# Patient Record
Sex: Male | Born: 1960 | Race: White | Hispanic: No | Marital: Married | Smoking: Never smoker
Health system: Southern US, Community
[De-identification: ages and names within clinical notes are randomized; demographics above are authoritative.]

## PROBLEM LIST (undated history)

## (undated) DIAGNOSIS — R519 Headache, unspecified: Secondary | ICD-10-CM

## (undated) DIAGNOSIS — N189 Chronic kidney disease, unspecified: Secondary | ICD-10-CM

## (undated) DIAGNOSIS — I10 Essential (primary) hypertension: Secondary | ICD-10-CM

## (undated) DIAGNOSIS — B019 Varicella without complication: Secondary | ICD-10-CM

## (undated) DIAGNOSIS — R51 Headache: Secondary | ICD-10-CM

## (undated) DIAGNOSIS — J45909 Unspecified asthma, uncomplicated: Secondary | ICD-10-CM

## (undated) DIAGNOSIS — G43909 Migraine, unspecified, not intractable, without status migrainosus: Secondary | ICD-10-CM

## (undated) DIAGNOSIS — N200 Calculus of kidney: Secondary | ICD-10-CM

## (undated) HISTORY — PX: COLONOSCOPY: SHX174

## (undated) HISTORY — PX: LITHOTRIPSY: SUR834

## (undated) HISTORY — DX: Calculus of kidney: N20.0

## (undated) HISTORY — PX: EYE SURGERY: SHX253

## (undated) HISTORY — DX: Headache, unspecified: R51.9

## (undated) HISTORY — DX: Varicella without complication: B01.9

## (undated) HISTORY — DX: Migraine, unspecified, not intractable, without status migrainosus: G43.909

## (undated) HISTORY — PX: TONSILLECTOMY: SUR1361

## (undated) HISTORY — DX: Essential (primary) hypertension: I10

## (undated) HISTORY — DX: Unspecified asthma, uncomplicated: J45.909

## (undated) HISTORY — DX: Chronic kidney disease, unspecified: N18.9

## (undated) HISTORY — DX: Headache: R51

## (undated) HISTORY — PX: REFRACTIVE SURGERY: SHX103

---

## 2015-04-04 LAB — BASIC METABOLIC PANEL
BUN: 17 (ref 4–21)
Creatinine: 0.8 (ref 0.6–1.3)
Glucose: 86
POTASSIUM: 3.6 (ref 3.4–5.3)
Sodium: 140 (ref 137–147)

## 2015-08-11 LAB — BASIC METABOLIC PANEL
BUN: 17 (ref 4–21)
Creatinine: 0.8 (ref 0.6–1.3)
Glucose: 93
Potassium: 3.8 (ref 3.4–5.3)
Sodium: 142 (ref 137–147)

## 2015-08-11 LAB — HEPATIC FUNCTION PANEL
ALK PHOS: 67 (ref 25–125)
ALT: 19 (ref 10–40)
AST: 19 (ref 14–40)
BILIRUBIN, TOTAL: 0.7

## 2015-08-11 LAB — LIPID PANEL
CHOLESTEROL: 153 (ref 0–200)
HDL: 65 (ref 35–70)
LDL CALC: 75
Triglycerides: 64 (ref 40–160)

## 2015-08-11 LAB — CBC AND DIFFERENTIAL
HCT: 43 (ref 41–53)
HEMOGLOBIN: 14.3 (ref 13.5–17.5)
PLATELETS: 197 (ref 150–399)
WBC: 4.8

## 2015-08-11 LAB — HEMOGLOBIN A1C: Hemoglobin A1C: 5.4

## 2015-08-11 LAB — PSA: PSA: 0.7

## 2016-02-24 LAB — PSA: PSA: 0.7

## 2016-02-24 LAB — HEPATIC FUNCTION PANEL
ALK PHOS: 67 (ref 25–125)
ALT: 15 (ref 10–40)
AST: 19 (ref 14–40)
BILIRUBIN, TOTAL: 0.6

## 2016-02-24 LAB — LIPID PANEL
Cholesterol: 142 (ref 0–200)
HDL: 61 (ref 35–70)
LDL CALC: 68
Triglycerides: 53 (ref 40–160)

## 2016-02-24 LAB — HEMOGLOBIN A1C: Hemoglobin A1C: 4.9

## 2016-02-24 LAB — BASIC METABOLIC PANEL
BUN: 18 (ref 4–21)
Creatinine: 1 (ref 0.6–1.3)
GLUCOSE: 98
Potassium: 3.7 (ref 3.4–5.3)
Sodium: 142 (ref 137–147)

## 2016-02-24 LAB — CBC AND DIFFERENTIAL
HEMATOCRIT: 43 (ref 41–53)
Hemoglobin: 14.5 (ref 13.5–17.5)
PLATELETS: 201 (ref 150–399)
WBC: 4.9

## 2016-09-28 LAB — BASIC METABOLIC PANEL
BUN: 18 (ref 4–21)
Creatinine: 0.9 (ref 0.6–1.3)
Glucose: 70
Potassium: 3.6 (ref 3.4–5.3)
SODIUM: 143 (ref 137–147)

## 2016-09-28 LAB — HEPATIC FUNCTION PANEL
ALK PHOS: 61 (ref 25–125)
ALT: 11 (ref 10–40)
AST: 17 (ref 14–40)
BILIRUBIN, TOTAL: 0.4

## 2017-07-05 ENCOUNTER — Encounter: Payer: Self-pay | Admitting: Family Medicine

## 2017-07-05 ENCOUNTER — Ambulatory Visit (INDEPENDENT_AMBULATORY_CARE_PROVIDER_SITE_OTHER): Payer: Managed Care, Other (non HMO) | Admitting: Family Medicine

## 2017-07-05 ENCOUNTER — Ambulatory Visit: Payer: Managed Care, Other (non HMO) | Admitting: Family Medicine

## 2017-07-05 VITALS — BP 118/76 | HR 66 | Ht 69.0 in | Wt 164.2 lb

## 2017-07-05 DIAGNOSIS — G44019 Episodic cluster headache, not intractable: Secondary | ICD-10-CM | POA: Diagnosis not present

## 2017-07-05 DIAGNOSIS — Z0000 Encounter for general adult medical examination without abnormal findings: Secondary | ICD-10-CM | POA: Diagnosis not present

## 2017-07-05 DIAGNOSIS — I10 Essential (primary) hypertension: Secondary | ICD-10-CM | POA: Diagnosis not present

## 2017-07-05 DIAGNOSIS — Z7189 Other specified counseling: Secondary | ICD-10-CM | POA: Diagnosis not present

## 2017-07-05 DIAGNOSIS — Z7184 Encounter for health counseling related to travel: Secondary | ICD-10-CM

## 2017-07-05 LAB — COMPREHENSIVE METABOLIC PANEL
ALBUMIN: 4.5 g/dL (ref 3.5–5.2)
ALT: 15 U/L (ref 0–53)
AST: 17 U/L (ref 0–37)
Alkaline Phosphatase: 71 U/L (ref 39–117)
BILIRUBIN TOTAL: 0.6 mg/dL (ref 0.2–1.2)
BUN: 15 mg/dL (ref 6–23)
CALCIUM: 9.9 mg/dL (ref 8.4–10.5)
CO2: 32 meq/L (ref 19–32)
CREATININE: 0.87 mg/dL (ref 0.40–1.50)
Chloride: 103 mEq/L (ref 96–112)
GFR: 95.42 mL/min (ref >60.00)
Glucose, Bld: 91 mg/dL (ref 70–99)
Potassium: 3.7 mEq/L (ref 3.5–5.1)
Sodium: 143 mEq/L (ref 135–145)
Total Protein: 6.5 g/dL (ref 6.0–8.3)

## 2017-07-05 LAB — PSA: PSA: 0.63 ng/mL (ref 0.10–4.00)

## 2017-07-05 LAB — LIPID PANEL
CHOLESTEROL: 141 mg/dL (ref 0–200)
HDL: 59.9 mg/dL (ref >39.00)
LDL Cholesterol: 70 mg/dL (ref 0–99)
NonHDL: 81.47
TRIGLYCERIDES: 58 mg/dL (ref 0.0–149.0)
Total CHOL/HDL Ratio: 2
VLDL: 11.6 mg/dL (ref 0.0–40.0)

## 2017-07-05 LAB — URINALYSIS, ROUTINE W REFLEX MICROSCOPIC
BILIRUBIN URINE: NEGATIVE
HGB URINE DIPSTICK: NEGATIVE
Ketones, ur: NEGATIVE
LEUKOCYTES UA: NEGATIVE
Nitrite: NEGATIVE
Specific Gravity, Urine: <=1.005 — AB (ref 1.000–1.030)
TOTAL PROTEIN, URINE-UPE24: NEGATIVE
UROBILINOGEN UA: 0.2 (ref 0.0–1.0)
Urine Glucose: NEGATIVE
WBC UA: NONE SEEN — AB (ref 0–?)
pH: 7 (ref 5.0–8.0)

## 2017-07-05 LAB — CBC
HCT: 42.9 % (ref 39.0–52.0)
Hemoglobin: 14.5 g/dL (ref 13.0–17.0)
MCHC: 34 g/dL (ref 30.0–36.0)
MCV: 90.5 fl (ref 78.0–100.0)
PLATELETS: 217 10*3/uL (ref 150.0–400.0)
RBC: 4.74 Mil/uL (ref 4.22–5.81)
RDW: 13.3 % (ref 11.5–15.5)
WBC: 4.8 10*3/uL (ref 4.0–10.5)

## 2017-07-05 LAB — TSH: TSH: 1.68 u[IU]/mL (ref 0.35–4.50)

## 2017-07-05 MED ORDER — INDOMETHACIN 25 MG PO CAPS: ORAL_CAPSULE | ORAL | 1 refills | Status: DC

## 2017-07-05 MED ORDER — ATOVAQUONE-PROGUANIL HCL 250-100 MG PO TABS: ORAL_TABLET | ORAL | 1 refills | Status: DC

## 2017-07-05 MED ORDER — GABAPENTIN 300 MG PO CAPS: 300.0000 mg | ORAL_CAPSULE | Freq: Two times a day (BID) | ORAL | 1 refills | Status: DC

## 2017-07-05 MED ORDER — HYDROCHLOROTHIAZIDE 25 MG PO TABS: 25.0000 mg | ORAL_TABLET | Freq: Every day | ORAL | 1 refills | Status: DC

## 2017-07-05 MED ORDER — TYPHOID VACCINE PO CPDR: 1.0000 | DELAYED_RELEASE_CAPSULE | ORAL | 0 refills | Status: DC

## 2017-07-05 MED ORDER — LOSARTAN POTASSIUM 25 MG PO TABS: 25.0000 mg | ORAL_TABLET | Freq: Every day | ORAL | 1 refills | Status: DC

## 2017-07-05 NOTE — Patient Instructions (Addendum)
TravelVaccine Information Vaccines, also called immunizations, can protect you from certain diseases. Vaccines can also prevent the spread of certain infections. It is important to see your health care provider or a travel medicine specialist 4-6 weeks before you travel. This allows time for recommended vaccines to take effect. It also provides enough time for you to get vaccines that must be given in a series over a period of days or weeks. Vaccines for travelers include:  Routine vaccines. These vaccines are standard.  Recommended travel vaccines. These vaccines are generally recommended before international travel.  Geographically required travel vaccines. These vaccines are necessary before travel to some countries or regions.  If it is less than 4 weeks before you leave, you should still see your health care provider. You might still benefit from vaccines or medicines. What are routine vaccines? Routine vaccines are shots that can protect you from common diseases in many parts of the world. Most routine vaccines are given at certain ages starting in childhood. Routine vaccines also include the annual flu (influenza) vaccine. It is important that you are up to date on your routine vaccines before you travel. You may be advised to get extra doses, also called booster vaccines, such as the Tdap (tetanus, diphtheria, and pertussis). What are recommended vaccines? Recommended travel vaccines change over time. Your health care provider can tell you what vaccines are recommended before your trip. The most common recommended vaccines before travel are hepatitis A and typhoid vaccines. Know your travel schedule when you visit your health care provider. The vaccines that are recommended before foreign travel will depend on several factors, such as:  The country or countries of travel.  Whether you will be traveling to rural areas.  How long you will be traveling.  The season of the year.  Your  age. Older adults should get a vaccine against a certain type of pneumonia (pneumococcal) and a vaccine against shingles (herpes zoster).  Your health status.  Your previous vaccines.  The annual influenza vaccine sometimes differs for the northern and southern hemispheres. You should:  Get both vaccines if you are traveling to the other hemisphere, and you have a chronic medical condition.  Get the vaccine shortly before or during the flu season, and only if the vaccine in your country differs from the vaccine in your destination country.  Get the other influenza vaccine either before leaving the country or shortly after arriving at the destination country.  What are geographically required vaccines? Children should be up to date with all of the recommended vaccinations. Parents should follow the standard vaccination guidelines that are recommended by the pediatrician. Some vaccines may be required during an ongoing outbreak of an infectious disease in a country or region. Your health care provider will be able to tell you about any outbreaks and required vaccines. Some examples of required vaccines include: Yellow fever vaccine  Proof of yellow fever immunization is currently required for most people before traveling to certain countries in [place] and [place].  If proof of immunization is incomplete or inaccurate, you could be quarantined, denied entry, or given another dose of vaccine at the travel site.  This vaccine can only be obtained at approved centers.  You should get the yellow fever vaccine at least 10 days before your trip.  After 10 days, most people show immunity to yellow fever.  If it has been longer than 10 years since you received the yellow fever vaccine, another dose is required.  Meningococcal vaccine    Meningococcal immunization may be required prior to travel to parts of [place] and [place].  Proof of meningococcal immunization is required by the  [place] for any person older than age 59 who is taking part in [place] or [place].  Visas for traveling to take part in hajj or umrah will not even be issued until there is proof of immunization. You should get this vaccine at least 10 days before your trip.  After 10 days, most people show immunity.  If it has been longer than 3 years since your last immunization, another dose is required.  Some travel circumstances may require additional vaccination with the following vaccines:  Hepatitis B.  Rabies.  Tick-borne encephalitis.  Malaria.  Where to find more information:  Centers for Disease Control and Prevention (CDC): http://www.wolf.info/  World Health Organization [facility]): RoleLink.com.br This information is not intended to replace advice given to you by your health care provider. Make sure you discuss any questions you have with your health care provider. Document Released: 04/04/2009 Document Revised: 01/10/2016 Document Reviewed: 09/20/2015 Elsevier Interactive Patient Education  2018 [place] Typhoid Vaccine: What You Need to Know 1. What is typhoid? Typhoid (typhoid fever) is a serious disease. It is caused by bacteria called Salmonella Typhi. Typhoid causes a high fever, fatigue, weakness, stomach pains, headache, loss of appetite, and sometimes a rash. If it is not treated, it can kill up to 30% of people who get it. Some people who get typhoid become "carriers," who can spread the disease to others. Generally, people get typhoid from contaminated food or water. Typhoid is rare in the [place] and most [place] citizens who get the disease get it while traveling. Typhoid strikes about 21 million people a year around the world and kills about 200,000. 2. Typhoid vaccines Typhoid vaccine can prevent typhoid. There are two vaccines to prevent typhoid. One is an inactivated (killed) vaccine gotten as a shot. The other is a live, attenuated (weakened) vaccine which is taken  orally (by mouth). 3. Who should get typhoid vaccine and when? Routine typhoid vaccination is not recommended in the [place], but typhoid vaccine is recommended for:  Travelers to parts of the world where typhoid is common. (NOTE: typhoid vaccine is not 100% effective and is not a substitute for being careful about what you eat or drink).  People in close contact with a typhoid carrier.  Laboratory workers who work with Salmonella Typhi bacteria.  Inactivated typhoid vaccine (shot)  One dose provides protection. It should be given at least 2 weeks before travel to allow the vaccine time to work.  A booster dose is needed every 2 years for people who remain at risk. Live typhoid vaccine (oral)  Four doses: one capsule every other day for a week (day 1, day 3, day 5, and day 7). The last dose should be given at least 1 week before travel to allow the vaccine time to work.  Swallow each dose about an hour before a meal with a cold or lukewarm drink. Do not chew the capsule.  A booster dose is needed every 5 years for people who remain at risk. Either vaccine may safely be given at the same time as other vaccines. 4. Some people should not get typhoid vaccine or should wait Inactivated typhoid vaccine (shot)  Should not be given to children younger than 76 years of age.  Anyone who has had a severe reaction to a previous dose of this vaccine should  not get another dose.  Anyone who has a severe allergy to any component of this vaccine should not get it. Tell your doctor if you have any severe allergies.  Anyone who is moderately or severely ill at the time the shot is scheduled should usually wait until they recover before getting the vaccine. Live typhoid vaccine (oral)  Should not be given to children younger than 39 years of age.  Anyone who has had a severe reaction to a previous dose of this vaccine should not get another dose.  Anyone who has a severe allergy to any  component of this vaccine should not get it. Tell your doctor if you have any severe allergies.  Anyone who is moderately or severely ill at the time the vaccine is scheduled should usually wait until they recover before getting it. Tell your doctor if you have an illness involving vomiting or diarrhea.  Anyone whose immune system is weakened should not get this vaccine. They should get the typhoid shot instead. This includes anyone who: ? has HIV/AIDS or another disease that affects the immune system, ? is being treated with drugs that affect the immune system, such as steroids for 2 weeks or longer, ? has any kind of cancer, ? is taking cancer treatment with radiation or drugs.  Oral typhoid vaccine should not be given until at least 3 days after taking certain antibiotics. Ask your doctor for more information. 5. What are the risks from typhoid vaccine? Like any medicine, a vaccine could cause a serious problem, such as a severe allergic reaction. The risk of typhoid vaccine causing serious harm, or death, is extremely small. Serious problems from either typhoid vaccine are very rare. Inactivated typhoid vaccine (shot)  Mild reactions ? Fever (up to about 1 person in 100) ? Headache (up to about 1 person in 45) ? Redness or swelling at the site of the injection (up to about 1 person in 15) Live typhoid vaccine (oral)  Mild reactions ? Fever or headache (up to about 1 person in 20) ? Stomach pain, nausea, vomiting, rash (rare) 6. What if there is a serious reaction? What should I look for? Look for anything that concerns you, such as signs of a severe allergic reaction, very high fever, or behavior changes. Signs of a severe allergic reaction can include hives, swelling of the face and throat, difficulty breathing, a fast heartbeat, dizziness, and weakness. These would start a few minutes to a few hours after the vaccination. What should I do?  If you think it is a severe allergic  reaction or other emergency that can't wait, call 9-1-1 or get the person to the nearest hospital. Otherwise, call your doctor.  Afterward, the reaction should be reported to the Vaccine Adverse Event Reporting System (VAERS). Your doctor might file this report, or you can do it yourself through the VAERS web site at www.vaers.SamedayNews.es or by calling (925) 842-4771. VAERS is only for reporting reactions. They do not give medical advice. 7. How can I learn more?  Ask your doctor.  Contact the Centers for Disease Control and Prevention (CDC): ? Call 757-127-3752 (1-800-CDC-INFO) or ? Visit CDC's website at http://www.ingram.com/ CDC Vaccine Information Statement (VIS) Typhoid Vaccine (09/26/2010) This information is not intended to replace advice given to you by your health care provider. Make sure you discuss any questions you have with your health care provider. Document Released: 12/14/2010 Document Revised: 03/10/2016 Document Reviewed: 03/10/2016 Elsevier Interactive Patient Education  2017 [employer].

## 2017-07-05 NOTE — Progress Notes (Signed)
Subjective:  Patient ID: Donald Hampton, male    DOB: [birth year]  Age: 57 y.o. MRN: [id number]  CC: Establish Care   HPI [name] presents for the establishment of care.  He is healthy 57 year old.  Past medical history of hypertension controlled with Cozaar and hydrochlorothiazide.  He has no problems with these medicines.  He has a history of cluster headaches that have been treated successfully with Neurontin prophylaxis.  He uses Indocin every morning to treat the occasional recurrent headache.  DTaP last year due to the birth of his granddaughter.  Colonoscopy back in 2010.  He does not smoke or use illicit drugs.  Drinks alcohol only occasionally.  He exercises regularly.  He is a runner.  He travels [place] for his [place] and requests a prescription for Malarone.  He is taking this drug with success in the past with few side effects.  He did not have bad dreams but he admitted to having interesting dreams.  History Donald Hampton has a past medical history of Chicken pox, Frequent headaches, Hypertension, and Migraines.   He has a past surgical history that includes Tonsillectomy.   His family history includes Arthritis in his father; Early death in his paternal grandfather and paternal grandmother; Heart disease in his maternal grandfather.He reports that  has never smoked. he has never used smokeless tobacco. He reports that he drinks alcohol. He reports that he does not use drugs.  Outpatient Medications Prior to Visit  Medication Sig Dispense Refill  . gabapentin (NEURONTIN) 300 MG capsule Take 1 capsule by mouth 2 (two) times daily.    . hydrochlorothiazide (HYDRODIURIL) 25 MG tablet Take 1 tablet by mouth daily.    . indomethacin (INDOCIN) 25 MG capsule Take 50 mg by mouth as needed.    Marland Kitchen losartan (COZAAR) 25 MG tablet Take 1 tablet by mouth daily.     No facility-administered medications prior to visit.     ROS Review of Systems  Constitutional: Negative.     HENT: Negative.   Eyes: Negative.   Respiratory: Negative.   Cardiovascular: Negative.   Gastrointestinal: Negative.   Endocrine: Negative for cold intolerance and heat intolerance.  Genitourinary: Negative for difficulty urinating, frequency and urgency.  Musculoskeletal: Negative for arthralgias and myalgias.  Skin: Negative for color change and rash.  Allergic/Immunologic: Negative for immunocompromised state.  Neurological: Negative.   Hematological: Negative.   Psychiatric/Behavioral: Negative.     Objective:  BP 118/76 (BP Location: Left Arm, Patient Position: Sitting, Cuff Size: Normal)   Pulse 66   Ht 5\' 9"  (1.753 m)   Wt 164 lb 4 oz (74.5 kg)   SpO2 98%   BMI 24.26 kg/m   Physical Exam  Constitutional: He is oriented to person, place, and time. He appears well-developed and well-nourished. No distress.  HENT:  Head: Normocephalic and atraumatic.  Right Ear: External ear normal.  Left Ear: External ear normal.  Mouth/Throat: Oropharynx is clear and moist. No oropharyngeal exudate.  Eyes: Conjunctivae are normal. Pupils are equal, round, and reactive to light. Right eye exhibits no discharge. Left eye exhibits no discharge. No scleral icterus.  Neck: Neck supple. No JVD present. No tracheal deviation present. No thyromegaly present.  Cardiovascular: Normal rate, regular rhythm and normal heart sounds.  Pulmonary/Chest: Effort normal and breath sounds normal. No stridor. No respiratory distress. He has no wheezes. He has no rales.  Abdominal: Bowel sounds are normal. He exhibits no distension. There is no tenderness. There is no  rebound and no guarding.  Lymphadenopathy:    He has no cervical adenopathy.  Neurological: He is alert and oriented to person, place, and time.  Skin: Skin is warm and dry. He is not diaphoretic.  Psychiatric: He has a normal mood and affect. His behavior is normal.      Assessment & Plan:   Jarrah was seen today for establish  care.  Diagnoses and all orders for this visit:  Essential hypertension -     CBC -     Comprehensive metabolic panel -     Urinalysis, Routine w reflex microscopic -     TSH -     hydrochlorothiazide (HYDRODIURIL) 25 MG tablet; Take 1 tablet (25 mg total) by mouth daily. -     losartan (COZAAR) 25 MG tablet; Take 1 tablet (25 mg total) by mouth daily.  Episodic cluster headache, not intractable -     CBC -     gabapentin (NEURONTIN) 300 MG capsule; Take 1 capsule (300 mg total) by mouth 2 (two) times daily. -     indomethacin (INDOCIN) 25 MG capsule; Take 1-2 tablets every 8 hours with food as needed.  Health care maintenance -     CBC -     Comprehensive metabolic panel -     HIV antibody -     Lipid panel -     PSA  Travel advice encounter -     atovaquone-proguanil (MALARONE) 250-100 MG TABS tablet; One tablet daily starting 2 days prior to exposure and continue for one week after leaving malaria prone area. -     typhoid (VIVOTIF) DR capsule; Take 1 capsule by mouth every other day.   I have changed [facility]. [facility] gabapentin, hydrochlorothiazide, indomethacin, and losartan. I am also having him start on atovaquone-proguanil and typhoid.  Meds ordered this encounter  Medications  . gabapentin (NEURONTIN) 300 MG capsule    Sig: Take 1 capsule (300 mg total) by mouth 2 (two) times daily.    Dispense:  180 capsule    Refill:  1  . hydrochlorothiazide (HYDRODIURIL) 25 MG tablet    Sig: Take 1 tablet (25 mg total) by mouth daily.    Dispense:  100 tablet    Refill:  1  . indomethacin (INDOCIN) 25 MG capsule    Sig: Take 1-2 tablets every 8 hours with food as needed.    Dispense:  60 capsule    Refill:  1  . losartan (COZAAR) 25 MG tablet    Sig: Take 1 tablet (25 mg total) by mouth daily.    Dispense:  100 tablet    Refill:  1  . atovaquone-proguanil (MALARONE) 250-100 MG TABS tablet    Sig: One tablet daily starting 2 days prior to exposure and continue for one  week after leaving malaria prone area.    Dispense:  30 tablet    Refill:  1  . typhoid (VIVOTIF) DR capsule    Sig: Take 1 capsule by mouth every other day.    Dispense:  4 capsule    Refill:  0     Follow-up: Return in about 6 months (around 01/05/2018).  Libby Maw, MD

## 2017-07-06 LAB — HIV ANTIBODY (ROUTINE TESTING W REFLEX): HIV 1&2 Ab, 4th Generation: NONREACTIVE

## 2017-07-16 ENCOUNTER — Encounter: Payer: Self-pay | Admitting: Family Medicine

## 2017-12-22 ENCOUNTER — Other Ambulatory Visit: Payer: Self-pay | Admitting: Family Medicine

## 2017-12-22 DIAGNOSIS — G44019 Episodic cluster headache, not intractable: Secondary | ICD-10-CM

## 2018-01-06 ENCOUNTER — Ambulatory Visit (INDEPENDENT_AMBULATORY_CARE_PROVIDER_SITE_OTHER): Payer: Managed Care, Other (non HMO) | Admitting: Family Medicine

## 2018-01-06 ENCOUNTER — Encounter: Payer: Self-pay | Admitting: Family Medicine

## 2018-01-06 VITALS — BP 122/80 | HR 65 | Temp 98.8°F | Ht 69.0 in | Wt 163.6 lb

## 2018-01-06 DIAGNOSIS — I10 Essential (primary) hypertension: Secondary | ICD-10-CM

## 2018-01-06 DIAGNOSIS — H6992 Unspecified Eustachian tube disorder, left ear: Secondary | ICD-10-CM | POA: Insufficient documentation

## 2018-01-06 DIAGNOSIS — J019 Acute sinusitis, unspecified: Secondary | ICD-10-CM | POA: Diagnosis not present

## 2018-01-06 LAB — BASIC METABOLIC PANEL
BUN: 14 mg/dL (ref 6–23)
CALCIUM: 9.7 mg/dL (ref 8.4–10.5)
CO2: 28 mEq/L (ref 19–32)
Chloride: 101 mEq/L (ref 96–112)
Creatinine, Ser: 0.85 mg/dL (ref 0.40–1.50)
GFR: 97.85 mL/min (ref >60.00)
GLUCOSE: 91 mg/dL (ref 70–99)
POTASSIUM: 5.2 meq/L — AB (ref 3.5–5.1)
SODIUM: 137 meq/L (ref 135–145)

## 2018-01-06 LAB — CBC
HEMATOCRIT: 40.4 % (ref 39.0–52.0)
Hemoglobin: 13.9 g/dL (ref 13.0–17.0)
MCHC: 34.5 g/dL (ref 30.0–36.0)
MCV: 88.1 fl (ref 78.0–100.0)
Platelets: 357 10*3/uL (ref 150.0–400.0)
RBC: 4.58 Mil/uL (ref 4.22–5.81)
RDW: 13 % (ref 11.5–15.5)
WBC: 8.6 10*3/uL (ref 4.0–10.5)

## 2018-01-06 MED ORDER — HYDROCHLOROTHIAZIDE 25 MG PO TABS: 25.0000 mg | ORAL_TABLET | Freq: Every day | ORAL | 1 refills | Status: DC

## 2018-01-06 MED ORDER — LOSARTAN POTASSIUM 25 MG PO TABS: 25.0000 mg | ORAL_TABLET | Freq: Every day | ORAL | 1 refills | Status: DC

## 2018-01-06 MED ORDER — PREDNISONE 20 MG PO TABS: 20.0000 mg | ORAL_TABLET | Freq: Two times a day (BID) | ORAL | 0 refills | Status: AC

## 2018-01-06 MED ORDER — AMOXICILLIN-POT CLAVULANATE 500-125 MG PO TABS: 1.0000 | ORAL_TABLET | Freq: Three times a day (TID) | ORAL | 0 refills | Status: AC

## 2018-01-06 NOTE — Progress Notes (Signed)
Subjective:  Patient ID: Donald Hampton, male    DOB: 11-28-1960  Age: 57 y.o. MRN: [id number]  CC: Sinusitis   HPI Donald Hampton presents for follow-up of a 2-week history of URI signs and symptoms that now include ongoing purulent rhinorrhea postnasal drip with a frontal headache and extreme fatigue and malaise.  Patient has had little cough, reactive airway disease or fever and chills.  This actually had started while he was on overseas assignment in the [place].  He was seen a week ago in [place] and prescribe 7 days of Augmentin 500 mg/125 every 8 for 7 days.  He was treated for conjunctivitis with chloramphenicol.  He also had noted a bloody discharge on his pillow from his left ear.  Did have a history of otitis media as a child.  He tells me that the [place] physician in wanted to hospitalize him.  Patient has been traveling from the [place] to here in 2 weeks cycles for prolonged period of time.  He is home for 2 days and then travels back to [place] for 2 weeks.  Fortunately he says that this is about to stop for him.  Outpatient Medications Prior to Visit  Medication Sig Dispense Refill  . gabapentin (NEURONTIN) 300 MG capsule TAKE 1 CAPSULE BY MOUTH TWICE A DAY 180 capsule 0  . indomethacin (INDOCIN) 25 MG capsule Take 1-2 tablets every 8 hours with food as needed. 60 capsule 1  . hydrochlorothiazide (HYDRODIURIL) 25 MG tablet Take 1 tablet (25 mg total) by mouth daily. 100 tablet 1  . losartan (COZAAR) 25 MG tablet Take 1 tablet (25 mg total) by mouth daily. 100 tablet 1  . atovaquone-proguanil (MALARONE) 250-100 MG TABS tablet One tablet daily starting 2 days prior to exposure and continue for one week after leaving malaria prone area. (Patient not taking: Reported on 01/06/2018) 30 tablet 1  . typhoid (VIVOTIF) DR capsule Take 1 capsule by mouth every other day. (Patient not taking: Reported on 01/06/2018) 4 capsule 0   No facility-administered medications prior to visit.      ROS Review of Systems  Constitutional: Positive for fatigue. Negative for chills, diaphoresis, fever and unexpected weight change.  HENT: Positive for congestion, ear discharge, ear pain, hearing loss, postnasal drip, rhinorrhea, sinus pressure and sinus pain. Negative for trouble swallowing and voice change.   Eyes: Negative for photophobia and visual disturbance.  Respiratory: Negative for chest tightness, shortness of breath and wheezing.   Cardiovascular: Negative.   Gastrointestinal: Negative.   Endocrine: Negative for cold intolerance and heat intolerance.  Genitourinary: Negative.   Musculoskeletal: Negative for gait problem and joint swelling.  Skin: Negative for pallor.  Allergic/Immunologic: Negative for immunocompromised state.  Neurological: Positive for weakness and headaches. Negative for numbness.  Hematological: Does not bruise/bleed easily.  Psychiatric/Behavioral: Negative.     Objective:  BP 122/80 (BP Location: Left Arm, Patient Position: Sitting, Cuff Size: Normal)   Pulse 65   Temp 98.8 F (37.1 C) (Oral)   Ht 5\' 9"  (1.753 m)   Wt 163 lb 9.6 oz (74.2 kg)   SpO2 97%   BMI 24.16 kg/m   BP Readings from Last 3 Encounters:  01/06/18 122/80  07/05/17 118/76    Wt Readings from Last 3 Encounters:  01/06/18 163 lb 9.6 oz (74.2 kg)  07/05/17 164 lb 4 oz (74.5 kg)    Physical Exam  Constitutional: He is oriented to person, place, and time. He appears well-developed and well-nourished.  No distress.  HENT:  Head: Normocephalic and atraumatic. Head is without right periorbital erythema and without left periorbital erythema.  Right Ear: External ear normal.  Left Ear: External ear normal. Tympanic membrane is not injected, not erythematous, not retracted and not bulging.  No middle ear effusion.  Ears:  Mouth/Throat: Oropharynx is clear and moist. No oropharyngeal exudate.  Eyes: Pupils are equal, round, and reactive to light. Conjunctivae and EOM are  normal. Right eye exhibits no discharge. Left eye exhibits no discharge. No scleral icterus.  Neck: Normal range of motion. Neck supple. No JVD present. No tracheal deviation present.  Cardiovascular: Normal rate, regular rhythm and normal heart sounds.  Pulmonary/Chest: Effort normal and breath sounds normal.  Abdominal: Bowel sounds are normal.  Neurological: He is alert and oriented to person, place, and time.  Skin: Skin is warm and dry. He is not diaphoretic.  Psychiatric: He has a normal mood and affect. His behavior is normal.    Lab Results  Component Value Date   WBC 4.8 07/05/2017   HGB 14.5 07/05/2017   HCT 42.9 07/05/2017   PLT 217.0 07/05/2017   GLUCOSE 91 07/05/2017   CHOL 141 07/05/2017   TRIG 58.0 07/05/2017   HDL 59.90 07/05/2017   LDLCALC 70 07/05/2017   ALT 15 07/05/2017   AST 17 07/05/2017   NA 143 07/05/2017   K 3.7 07/05/2017   CL 103 07/05/2017   CREATININE 0.87 07/05/2017   BUN 15 07/05/2017   CO2 32 07/05/2017   TSH 1.68 07/05/2017   PSA 0.63 07/05/2017   HGBA1C 4.9 02/24/2016    Patient was never admitted.  Assessment & Plan:   Donald Hampton was seen today for sinusitis.  Diagnoses and all orders for this visit:  Essential hypertension -     hydrochlorothiazide (HYDRODIURIL) 25 MG tablet; Take 1 tablet (25 mg total) by mouth daily. -     losartan (COZAAR) 25 MG tablet; Take 1 tablet (25 mg total) by mouth daily. -     CBC -     Basic metabolic panel  Acute sinusitis, recurrence not specified, unspecified location -     amoxicillin-clavulanate (AUGMENTIN) 500-125 MG tablet; Take 1 tablet (500 mg total) by mouth 3 (three) times daily for 7 days. -     predniSONE (DELTASONE) 20 MG tablet; Take 1 tablet (20 mg total) by mouth 2 (two) times daily with a meal for 7 days. -     CBC  Eustachian tube disorder, left -     predniSONE (DELTASONE) 20 MG tablet; Take 1 tablet (20 mg total) by mouth 2 (two) times daily with a meal for 7 days.   I am having  Donald Hampton. Donald Hampton start on amoxicillin-clavulanate and predniSONE. I am also having him maintain his indomethacin, atovaquone-proguanil, typhoid, gabapentin, hydrochlorothiazide, and losartan.  Meds ordered this encounter  Medications  . hydrochlorothiazide (HYDRODIURIL) 25 MG tablet    Sig: Take 1 tablet (25 mg total) by mouth daily.    Dispense:  100 tablet    Refill:  1  . losartan (COZAAR) 25 MG tablet    Sig: Take 1 tablet (25 mg total) by mouth daily.    Dispense:  100 tablet    Refill:  1  . amoxicillin-clavulanate (AUGMENTIN) 500-125 MG tablet    Sig: Take 1 tablet (500 mg total) by mouth 3 (three) times daily for 7 days.    Dispense:  21 tablet    Refill:  0  . predniSONE (DELTASONE)  20 MG tablet    Sig: Take 1 tablet (20 mg total) by mouth 2 (two) times daily with a meal for 7 days.    Dispense:  14 tablet    Refill:  0   We will treat with another 7 days of Augmentin and prednisone 20 twice daily.  Information given regarding eustachian tube dysfunction.  Refill BP meds and checking BMP and CBC.  Follow-up next week if not continuing to improve.  Follow-up: Return in about 6 months (around 07/07/2018), or if symptoms worsen or fail to improve.  Libby Maw, MD

## 2018-01-06 NOTE — Patient Instructions (Signed)
Eustachian Tube Dysfunction The eustachian tube connects the middle ear to the back of the nose. It regulates air pressure in the middle ear by allowing air to move between the ear and nose. It also helps to drain fluid from the middle ear space. When the eustachian tube does not function properly, air pressure, fluid, or both can build up in the middle ear. Eustachian tube dysfunction can affect one or both ears. What are the causes? This condition happens when the eustachian tube becomes blocked or cannot open normally. This may result from:  Ear infections.  Colds and other upper respiratory infections.  Allergies.  Irritation, such as from cigarette smoke or acid from the stomach coming up into the esophagus (gastroesophageal reflux).  Sudden changes in air pressure, such as from descending in an airplane.  Abnormal growths in the nose or throat, such as nasal polyps, tumors, or enlarged tissue at the back of the throat (adenoids).  What increases the risk? This condition may be more likely to develop in people who smoke and people who are overweight. Eustachian tube dysfunction may also be more likely to develop in children, especially children who have:  Certain birth defects of the mouth, such as cleft palate.  Large tonsils and adenoids.  What are the signs or symptoms? Symptoms of this condition may include:  A feeling of fullness in the ear.  Ear pain.  Clicking or popping noises in the ear.  Ringing in the ear.  Hearing loss.  Loss of balance.  Symptoms may get worse when the air pressure around you changes, such as when you travel to an area of high elevation or fly on an airplane. How is this diagnosed? This condition may be diagnosed based on:  Your symptoms.  A physical exam of your ear, nose, and throat.  Tests, such as those that measure: ? The movement of your eardrum (tympanogram). ? Your hearing (audiometry).  How is this treated? Treatment  depends on the cause and severity of your condition. If your symptoms are mild, you may be able to relieve your symptoms by moving air into ("popping") your ears. If you have symptoms of fluid in your ears, treatment may include:  Decongestants.  Antihistamines.  Nasal sprays or ear drops that contain medicines that reduce swelling (steroids).  In some cases, you may need to have a procedure to drain the fluid in your eardrum (myringotomy). In this procedure, a small tube is placed in the eardrum to:  Drain the fluid.  Restore the air in the middle ear space.  Follow these instructions at home:  Take over-the-counter and prescription medicines only as told by your health care provider.  Use techniques to help pop your ears as recommended by your health care provider. These may include: ? Chewing gum. ? Yawning. ? Frequent, forceful swallowing. ? Closing your mouth, holding your nose closed, and gently blowing as if you are trying to blow air out of your nose.  Do not do any of the following until your health care provider approves: ? Travel to high altitudes. ? Fly in airplanes. ? Work in a pressurized cabin or room. ? Scuba dive.  Keep your ears dry. Dry your ears completely after showering or bathing.  Do not smoke.  Keep all follow-up visits as told by your health care provider. This is important. Contact a health care provider if:  Your symptoms do not go away after treatment.  Your symptoms come back after treatment.  You are   unable to pop your ears.  You have: ? A fever. ? Pain in your ear. ? Pain in your head or neck. ? Fluid draining from your ear.  Your hearing suddenly changes.  You become very dizzy.  You lose your balance. This information is not intended to replace advice given to you by your health care provider. Make sure you discuss any questions you have with your health care provider. Document Released: 05/13/2015 Document Revised: 09/22/2015  Document Reviewed: 05/05/2014 Elsevier Interactive Patient Education  2018 [employer]vier Inc.  

## 2018-01-10 ENCOUNTER — Ambulatory Visit: Payer: Managed Care, Other (non HMO) | Admitting: Family Medicine

## 2018-03-13 ENCOUNTER — Ambulatory Visit (INDEPENDENT_AMBULATORY_CARE_PROVIDER_SITE_OTHER): Payer: Managed Care, Other (non HMO) | Admitting: Family Medicine

## 2018-03-13 ENCOUNTER — Encounter: Payer: Self-pay | Admitting: Family Medicine

## 2018-03-13 VITALS — BP 128/70 | HR 57 | Ht 69.0 in | Wt 162.5 lb

## 2018-03-13 DIAGNOSIS — I10 Essential (primary) hypertension: Secondary | ICD-10-CM

## 2018-03-13 DIAGNOSIS — N528 Other male erectile dysfunction: Secondary | ICD-10-CM

## 2018-03-13 DIAGNOSIS — K4090 Unilateral inguinal hernia, without obstruction or gangrene, not specified as recurrent: Secondary | ICD-10-CM

## 2018-03-13 MED ORDER — LOSARTAN POTASSIUM 50 MG PO TABS: 50.0000 mg | ORAL_TABLET | Freq: Every day | ORAL | 1 refills | Status: DC

## 2018-03-13 NOTE — Patient Instructions (Signed)

## 2018-03-13 NOTE — Progress Notes (Signed)
Subjective:  Patient ID: Donald Hampton, male    DOB: 04-17-1961  Age: 57 y.o. MRN: [id number]  CC: pain in groin area (x a couple weeks, not severe)   HPI Donald Hampton presents for evaluation of discomfort in his right groin area over the last month or so.  He is married in a stable relationship and not active outside of the marriage.  There is been no dysuria frequency or discharge.  He is also experienced new onset ED over the last several weeks.  He is quite active physically he is a long-distance runner.  He has not been riding his bicycle excessively.  Last HDL was 59 with an LDL of 70.  PSA was 0.63.  He does not smoke.  He rarely drinks alcohol.  He is a high stress job that involves frequent overseas and stateside air travel.  He is taking HCTZ for years pressure control.  Urine flow is excellent without hesitancy or decreased force of stream.  Outpatient Medications Prior to Visit  Medication Sig Dispense Refill  . gabapentin (NEURONTIN) 300 MG capsule TAKE 1 CAPSULE BY MOUTH TWICE A DAY 180 capsule 0  . indomethacin (INDOCIN) 25 MG capsule Take 1-2 tablets every 8 hours with food as needed. 60 capsule 1  . hydrochlorothiazide (HYDRODIURIL) 25 MG tablet Take 1 tablet (25 mg total) by mouth daily. 100 tablet 1  . losartan (COZAAR) 25 MG tablet Take 1 tablet (25 mg total) by mouth daily. 100 tablet 1  . atovaquone-proguanil (MALARONE) 250-100 MG TABS tablet One tablet daily starting 2 days prior to exposure and continue for one week after leaving malaria prone area. (Patient not taking: Reported on 01/06/2018) 30 tablet 1  . typhoid (VIVOTIF) DR capsule Take 1 capsule by mouth every other day. (Patient not taking: Reported on 01/06/2018) 4 capsule 0   No facility-administered medications prior to visit.     ROS Review of Systems  Constitutional: Negative.   HENT: Negative.   Respiratory: Negative.   Cardiovascular: Negative.   Gastrointestinal: Negative.   Genitourinary:  Negative for difficulty urinating, discharge, dysuria, frequency and urgency.  Musculoskeletal: Negative for arthralgias and myalgias.  Skin: Negative.   Neurological: Negative for weakness and numbness.  Psychiatric/Behavioral: Negative.     Objective:  BP 128/70 (BP Location: Left Arm, Patient Position: Sitting, Cuff Size: Normal)   Pulse (!) 57   Ht 5\' 9"  (1.753 m)   Wt 162 lb 8 oz (73.7 kg)   SpO2 99%   BMI 24.00 kg/m   BP Readings from Last 3 Encounters:  03/13/18 128/70  01/06/18 122/80  07/05/17 118/76    Wt Readings from Last 3 Encounters:  03/13/18 162 lb 8 oz (73.7 kg)  01/06/18 163 lb 9.6 oz (74.2 kg)  07/05/17 164 lb 4 oz (74.5 kg)    Physical Exam  Constitutional: He is oriented to person, place, and time. He appears well-developed and well-nourished. No distress.  HENT:  Head: Normocephalic and atraumatic.  Right Ear: External ear normal.  Left Ear: External ear normal.  Eyes: Right eye exhibits no discharge. Left eye exhibits no discharge. No scleral icterus.  Pulmonary/Chest: Effort normal.  Abdominal: Soft. Bowel sounds are normal. He exhibits no distension and no mass. There is no tenderness. There is no guarding. A hernia is present. Hernia confirmed positive in the right inguinal area. Hernia confirmed negative in the ventral area.  Genitourinary: Right testis shows no mass, no swelling and no tenderness. Right testis is  descended. Left testis shows no mass, no swelling and no tenderness. Left testis is descended. Circumcised. No hypospadias, penile erythema or penile tenderness. No discharge found.  Neurological: He is alert and oriented to person, place, and time.  Skin: Skin is warm and dry. He is not diaphoretic.    Lab Results  Component Value Date   WBC 8.6 01/06/2018   HGB 13.9 01/06/2018   HCT 40.4 01/06/2018   PLT 357.0 01/06/2018   GLUCOSE 91 01/06/2018   CHOL 141 07/05/2017   TRIG 58.0 07/05/2017   HDL 59.90 07/05/2017   LDLCALC 70  07/05/2017   ALT 15 07/05/2017   AST 17 07/05/2017   NA 137 01/06/2018   K 5.2 (H) 01/06/2018   CL 101 01/06/2018   CREATININE 0.85 01/06/2018   BUN 14 01/06/2018   CO2 28 01/06/2018   TSH 1.68 07/05/2017   PSA 0.63 07/05/2017   HGBA1C 4.9 02/24/2016    Patient was never admitted.  Assessment & Plan:   Donald Hampton was seen today for pain in groin area.  Diagnoses and all orders for this visit:  Essential hypertension -     losartan (COZAAR) 50 MG tablet; Take 1 tablet (50 mg total) by mouth daily.  Unilateral inguinal hernia without obstruction or gangrene, recurrence not specified -     Ambulatory referral to General Surgery  Other male erectile dysfunction   I have discontinued Donald Hampton. Donald Hampton atovaquone-proguanil, typhoid, hydrochlorothiazide, and losartan. I am also having him start on losartan. Additionally, I am having him maintain his indomethacin and gabapentin.  Meds ordered this encounter  Medications  . losartan (COZAAR) 50 MG tablet    Sig: Take 1 tablet (50 mg total) by mouth daily.    Dispense:  90 tablet    Refill:  1   Have discontinued HCTZ and increased Cozaar to 50 mg daily.  Patient would like to try this before trying to PD he inhibitor.  Anticipatory guidance was given on the ED.  Patient will check and record his blood pressures a month.  Follow-up: Return in about 1 month (around 04/12/2018).  Libby Maw, MD

## 2018-03-24 ENCOUNTER — Ambulatory Visit: Payer: Self-pay | Admitting: Surgery

## 2018-03-24 NOTE — H&P (Signed)
Donald Hampton Documented: 03/24/2018 8:44 AM Location: [facility] Patient #: [id number] DOB: Mar 12, 1961 Married / Language: [name] / Race: White Male  History of Present Illness Donald Hector MD; 03/24/2018 9:35 AM) The patient is a 57 year old male who presents with an inguinal hernia. Note for "Inguinal hernia": ` ` ` Patient sent for surgical consultation at the request of Abelino Derrick, MD  Chief Complaint: Inguinal hernia. ` ` The patient is a gentleman with hypertension well controlled who noted some right groin intermittent pain that concerned him. Discussed with primary care office. Right inguinal hernia suspected. Consultation requested. He comes today by himself. He notes some discomfort in the right groin. Occasional mild pain. No issues on the left side. No history prior surgeries. He does have a history of kidney stones. He is an avid runner. He does a lot of flying and traveling for his job. No problems with urination or defecation. No problems with erection or ejaculation. He does not smoke. He is not diabetic. He moves his bowels every day. Otherwise rather healthy.  (Review of systems as stated in this history (HPI) or in the review of systems. Otherwise all other 12 point ROS are negative) ` ` `   Past Surgical History Illene Regulus, CMA; 03/24/2018 8:45 AM) Tonsillectomy  Diagnostic Studies History Lars Mage Spillers, CMA; 03/24/2018 8:45 AM) Colonoscopy 5-10 years ago  Allergies Lars Mage Spillers, CMA; 03/24/2018 8:46 AM) No Known Drug Allergies [03/24/2018]:  Medication History (Alisha Spillers, CMA; 03/24/2018 8:46 AM) Gabapentin (300MG  Capsule, Oral) Active. Losartan Potassium (25MG  Tablet, Oral) Active. Medications Reconciled  Social History Illene Regulus, CMA; 03/24/2018 8:45 AM) Alcohol use Occasional alcohol use. Caffeine use Carbonated beverages, Coffee, Tea. No drug use Tobacco use Never  smoker.  Family History Illene Regulus, [place]; 03/24/2018 8:45 AM) Alcohol Abuse Father.  Other Problems Lars Mage Spillers, CMA; 03/24/2018 8:45 AM) High blood pressure Kidney Stone     Review of Systems Lars Mage Spillers CMA; 03/24/2018 8:45 AM) General Not Present- Appetite Loss, Chills, Fatigue, Fever, Night Sweats, Weight Gain and Weight Loss. Skin Not Present- Change in Wart/Mole, Dryness, Hives, Jaundice, New Lesions, Non-Healing Wounds, Rash and Ulcer. HEENT Present- Wears glasses/contact lenses. Not Present- Earache, Hearing Loss, Hoarseness, Nose Bleed, Oral Ulcers, Ringing in the Ears, Seasonal Allergies, Sinus Pain, Sore Throat, Visual Disturbances and Yellow Eyes. Respiratory Not Present- Bloody sputum, Chronic Cough, Difficulty Breathing, Snoring and Wheezing. Breast Not Present- Breast Mass, Breast Pain, Nipple Discharge and Skin Changes. Cardiovascular Present- Leg Cramps. Not Present- Chest Pain, Difficulty Breathing Lying Down, Palpitations, Rapid Heart Rate, Shortness of Breath and Swelling of Extremities. Male Genitourinary Present- Impotence. Not Present- Blood in Urine, Change in Urinary Stream, Frequency, Nocturia, Painful Urination, Urgency and Urine Leakage. Musculoskeletal Not Present- Back Pain, Joint Pain, Joint Stiffness, Muscle Pain, Muscle Weakness and Swelling of Extremities. Neurological Present- Headaches. Not Present- Decreased Memory, Fainting, Numbness, Seizures, Tingling, Tremor, Trouble walking and Weakness. Psychiatric Not Present- Anxiety, Bipolar, Change in Sleep Pattern, Depression, Fearful and Frequent crying. Endocrine Not Present- Cold Intolerance, Excessive Hunger, Hair Changes, Heat Intolerance, Hot flashes and New Diabetes. Hematology Not Present- Blood Thinners, Easy Bruising, Excessive bleeding, Gland problems, HIV and Persistent Infections.  Vitals (Alisha Spillers CMA; 03/24/2018 8:46 AM) 03/24/2018 8:45 AM Weight: 168 lb Height:  69in Body Surface Area: 1.92 m Body Mass Index: 24.81 kg/m  Pulse: 52 (Regular)  BP: 138/82 (Sitting, Left Arm, Standard)      Physical Exam Donald Hector MD; 03/24/2018 9:29 AM)  General Mental Status-Alert. General Appearance-Not in acute distress, Not Sickly. Orientation-Oriented X3. Hydration-Well hydrated. Voice-Normal.  Integumentary Global Assessment Upon inspection and palpation of skin surfaces of the - Axillae: non-tender, no inflammation or ulceration, no drainage. and Distribution of scalp and body hair is normal. General Characteristics Temperature - normal warmth is noted.  Head and Neck Head-normocephalic, atraumatic with no lesions or palpable masses. Face Global Assessment - atraumatic, no absence of expression. Neck Global Assessment - no abnormal movements, no bruit auscultated on the right, no bruit auscultated on the left, no decreased range of motion, non-tender. Trachea-midline. Thyroid Gland Characteristics - non-tender.  Eye Eyeball - Left-Extraocular movements intact, No Nystagmus. Eyeball - Right-Extraocular movements intact, No Nystagmus. Cornea - Left-No Hazy. Cornea - Right-No Hazy. Sclera/Conjunctiva - Left-No scleral icterus, No Discharge. Sclera/Conjunctiva - Right-No scleral icterus, No Discharge. Pupil - Left-Direct reaction to light normal. Pupil - Right-Direct reaction to light normal. Note: Wears glasses. Vision corrected  ENMT Ears Pinna - Left - no drainage observed, no generalized tenderness observed. Right - no drainage observed, no generalized tenderness observed. Nose and Sinuses External Inspection of the Nose - no destructive lesion observed. Inspection of the nares - Left - quiet respiration. Right - quiet respiration. Mouth and Throat Lips - Upper Lip - no fissures observed, no pallor noted. Lower Lip - no fissures observed, no pallor noted. Nasopharynx - no discharge present.  Oral Cavity/Oropharynx - Tongue - no dryness observed. Oral Mucosa - no cyanosis observed. Hypopharynx - no evidence of airway distress observed.  Chest and Lung Exam Inspection Movements - Normal and Symmetrical. Accessory muscles - No use of accessory muscles in breathing. Palpation Palpation of the chest reveals - Non-tender. Auscultation Breath sounds - Normal and Clear.  Cardiovascular Auscultation Rhythm - Regular. Murmurs & Other Heart Sounds - Auscultation of the heart reveals - No Murmurs and No Systolic Clicks.  Abdomen Inspection Inspection of the abdomen reveals - No Visible peristalsis and No Abnormal pulsations. Umbilicus - No Bleeding, No Urine drainage. Palpation/Percussion Palpation and Percussion of the abdomen reveal - Soft, Non Tender, No Rebound tenderness, No Rigidity (guarding) and No Cutaneous hyperesthesia. Note: Abdomen soft. Nontender. Not distended. No umbilical or incisional hernias. No guarding.  Male Genitourinary Sexual Maturity Tanner 5 - Adult hair pattern and Adult penile size and shape. Note: Right greater than left bilateral inguinal hernias. They seem medial consistent with direct space hernias. Otherwise normal external genitalia. Testes epididymides and cords normal.  Peripheral Vascular Upper Extremity Inspection - Left - No Cyanotic nailbeds, Not Ischemic. Right - No Cyanotic nailbeds, Not Ischemic.  Neurologic Neurologic evaluation reveals -normal attention span and ability to concentrate, able to name objects and repeat phrases. Appropriate fund of knowledge , normal sensation and normal coordination. Mental Status Affect - not angry, not paranoid. Cranial Nerves-Normal Bilaterally. Gait-Normal.  Neuropsychiatric Mental status exam performed with findings of-able to articulate well with normal speech/language, rate, volume and coherence, thought content normal with ability to perform basic computations and apply abstract  reasoning and no evidence of hallucinations, delusions, obsessions or homicidal/suicidal ideation.  Musculoskeletal Global Assessment Spine, Ribs and Pelvis - no instability, subluxation or laxity. Right Upper Extremity - no instability, subluxation or laxity.  Lymphatic Head & Neck  General Head & Neck Lymphatics: Bilateral - Description - No Localized lymphadenopathy. Axillary  General Axillary Region: Bilateral - Description - No Localized lymphadenopathy. Femoral & Inguinal  Generalized Femoral & Inguinal Lymphatics: Left - Description - No Localized lymphadenopathy. Right - Description -  No Localized lymphadenopathy.    Assessment & Plan Donald Hector MD; 03/24/2018 9:35 AM)  BILATERAL INGUINAL HERNIA WITHOUT OBSTRUCTION OR GANGRENE, RECURRENCE NOT SPECIFIED (K40.20) Impression: Bilateral inguinal hernias. Right greater than left. Right sensitive.  Because he is symptomatic on the right side and given his decent likelihood of significant longevity, Ihink he would benefit from surgery to get these repaired before they become larger or more of an issue. He is in good shape and not severely debilitated by them, so he has time to coordinate surgery sodas not is disruptive given his heavy traveling and flying for his work. He is thinking about February/March. We will work to coordinate a convenient time.   PREOP - ING HERNIA - ENCOUNTER FOR PREOPERATIVE EXAMINATION FOR GENERAL SURGICAL PROCEDURE (Z01.818)  Current Plans You are being scheduled for surgery- Our schedulers will call you.  You should hear from our office's scheduling department within 5 working days about the location, date, and time of surgery. We try to make accommodations for patient's preferences in scheduling surgery, but sometimes the OR schedule or the surgeon's schedule prevents [place] from making those accommodations.  If you have not heard from our office [phone] in 5 working days, call the office  and ask for your surgeon's nurse.  If you have other questions about your diagnosis, plan, or surgery, call the office and ask for your surgeon's nurse.  Written instructions provided The anatomy & physiology of the abdominal wall and pelvic floor was discussed. The pathophysiology of hernias in the inguinal and pelvic region was discussed. Natural history risks such as progressive enlargement, pain, incarceration, and strangulation was discussed. Contributors to complications such as smoking, obesity, diabetes, prior surgery, etc were discussed.  I feel the risks of no intervention will lead to serious problems that outweigh the operative risks; therefore, I recommended surgery to reduce and repair the hernia. I explained laparoscopic techniques with possible need for an open approach. I noted usual use of mesh to patch and/or buttress hernia repair  Risks such as bleeding, infection, abscess, need for further treatment, heart attack, death, and other risks were discussed. I noted a good likelihood this will help address the problem. Goals of post-operative recovery were discussed as well. Possibility that this will not correct all symptoms was explained. I stressed the importance of low-impact activity, aggressive pain control, avoiding constipation, & not pushing through pain to minimize risk of post-operative chronic pain or injury. Possibility of reherniation was discussed. We will work to minimize complications.  An educational handout further explaining the pathology & treatment options was given as well. Questions were answered. The patient expresses understanding & wishes to proceed with surgery.  Pt Education - Pamphlet Given - Laparoscopic Hernia Repair: discussed with patient and provided information. Pt Education - [facility] Pain Control (Darelle Kings) Pt Education - [facility] Hernia Post-Op HCI (Aubert Choyce): discussed with patient and provided information. Pt Education - [facility] Mesh education:  discussed with patient and provided information.  Donald Hector, MD, FACS, MASCRS Gastrointestinal and Minimally Invasive Surgery    [address], [place], [place] [address] [phone] Main / Paging [phone] Fax

## 2018-04-11 ENCOUNTER — Ambulatory Visit (INDEPENDENT_AMBULATORY_CARE_PROVIDER_SITE_OTHER): Payer: Managed Care, Other (non HMO) | Admitting: Family Medicine

## 2018-04-11 ENCOUNTER — Encounter: Payer: Self-pay | Admitting: Family Medicine

## 2018-04-11 VITALS — BP 136/80 | HR 71 | Ht 69.0 in | Wt 168.0 lb

## 2018-04-11 DIAGNOSIS — I10 Essential (primary) hypertension: Secondary | ICD-10-CM | POA: Diagnosis not present

## 2018-04-11 MED ORDER — LOSARTAN POTASSIUM 100 MG PO TABS: 100.0000 mg | ORAL_TABLET | Freq: Every day | ORAL | 1 refills | Status: DC

## 2018-04-11 NOTE — Progress Notes (Signed)
Established Patient Office Visit  Subjective:  Patient ID: Donald Hampton, male    DOB: 28-Apr-1961  Age: 57 y.o. MRN: [id number]  CC:  Chief Complaint  Patient presents with  . Follow-up    HPI [name] presents for follow up on his blood pressure.  Blood pressure at home is been running in the 140s over 90s.  He is taking Hyzaar in the past and has been tolerating the losartan alone by itself.  He appreciates not having the HCTZ component as part of his pressure regimen.  He did see surgery for evaluation of his right inguinal hernia.  They felt as though there was a other additional hernia on the left.  I have recommended repair with mesh on the right side.  He is planning on having this done in February.  Past Medical History:  Diagnosis Date  . Chicken pox   . Frequent headaches   . Hypertension   . Migraines     Past Surgical History:  Procedure Laterality Date  . TONSILLECTOMY      Family History  Problem Relation Age of Onset  . Arthritis Father   . Heart disease Maternal Grandfather   . Early death Paternal Grandmother   . Early death Paternal Grandfather     Social History   Socioeconomic History  . Marital status: Married    Spouse name: Not on file  . Number of children: Not on file  . Years of education: Not on file  . Highest education level: Not on file  Occupational History  . Not on file  Social Needs  . Financial resource strain: Not on file  . Food insecurity:    Worry: Not on file    Inability: Not on file  . Transportation needs:    Medical: Not on file    Non-medical: Not on file  Tobacco Use  . Smoking status: Never Smoker  . Smokeless tobacco: Never Used  Substance and Sexual Activity  . Alcohol use: Yes    Comment: rarely  . Drug use: No  . Sexual activity: Yes    Partners: Female  Lifestyle  . Physical activity:    Days per week: Not on file    Minutes per session: Not on file  . Stress: Not on file  Relationships   . Social connections:    Talks on phone: Not on file    Gets together: Not on file    Attends religious service: Not on file    Active member of club or organization: Not on file    Attends meetings of clubs or organizations: Not on file    Relationship status: Not on file  . Intimate partner violence:    Fear of current or ex partner: Not on file    Emotionally abused: Not on file    Physically abused: Not on file    Forced sexual activity: Not on file  Other Topics Concern  . Not on file  Social History Narrative  . Not on file    Outpatient Medications Prior to Visit  Medication Sig Dispense Refill  . gabapentin (NEURONTIN) 300 MG capsule TAKE 1 CAPSULE BY MOUTH TWICE A DAY 180 capsule 0  . indomethacin (INDOCIN) 25 MG capsule Take 1-2 tablets every 8 hours with food as needed. 60 capsule 1  . losartan (COZAAR) 50 MG tablet Take 1 tablet (50 mg total) by mouth daily. 90 tablet 1   No facility-administered medications prior to visit.  Not on File  ROS Review of Systems  Constitutional: Negative for fatigue, fever and unexpected weight change.  Respiratory: Negative.   Cardiovascular: Negative.   Gastrointestinal: Negative.   Genitourinary: Negative.   Neurological: Negative.   Psychiatric/Behavioral: Negative.       Objective:    Physical Exam  Constitutional: He is oriented to person, place, and time. He appears well-developed and well-nourished. No distress.  HENT:  Head: Normocephalic and atraumatic.  Right Ear: External ear normal.  Left Ear: External ear normal.  Eyes: Right eye exhibits no discharge. Left eye exhibits no discharge. No scleral icterus.  Neck: No JVD present. No tracheal deviation present.  Pulmonary/Chest: Effort normal. No stridor.  Neurological: He is alert and oriented to person, place, and time.  Skin: Skin is warm and dry. He is not diaphoretic.  Psychiatric: He has a normal mood and affect. His behavior is normal.    BP  136/80   Pulse 71   Ht 5\' 9"  (1.753 m)   Wt 168 lb (76.2 kg)   SpO2 97%   BMI 24.81 kg/m  Wt Readings from Last 3 Encounters:  04/11/18 168 lb (76.2 kg)  03/13/18 162 lb 8 oz (73.7 kg)  01/06/18 163 lb 9.6 oz (74.2 kg)   BP Readings from Last 3 Encounters:  04/11/18 136/80  03/13/18 128/70  01/06/18 122/80   Health Maintenance Due  Topic Date Due  . Hepatitis C Screening  [birth year]    There are no preventive care reminders to display for this patient.  Lab Results  Component Value Date   TSH 1.68 07/05/2017   Lab Results  Component Value Date   WBC 8.6 01/06/2018   HGB 13.9 01/06/2018   HCT 40.4 01/06/2018   MCV 88.1 01/06/2018   PLT 357.0 01/06/2018   Lab Results  Component Value Date   NA 137 01/06/2018   K 5.2 (H) 01/06/2018   CO2 28 01/06/2018   GLUCOSE 91 01/06/2018   BUN 14 01/06/2018   CREATININE 0.85 01/06/2018   BILITOT 0.6 07/05/2017   ALKPHOS 71 07/05/2017   AST 17 07/05/2017   ALT 15 07/05/2017   PROT 6.5 07/05/2017   ALBUMIN 4.5 07/05/2017   CALCIUM 9.7 01/06/2018   GFR 97.85 01/06/2018   Lab Results  Component Value Date   CHOL 141 07/05/2017   Lab Results  Component Value Date   HDL 59.90 07/05/2017   Lab Results  Component Value Date   LDLCALC 70 07/05/2017   Lab Results  Component Value Date   TRIG 58.0 07/05/2017   Lab Results  Component Value Date   CHOLHDL 2 07/05/2017   Lab Results  Component Value Date   HGBA1C 4.9 02/24/2016      Assessment & Plan:   Problem List Items Addressed This Visit      Cardiovascular and Mediastinum   Essential hypertension - Primary   Relevant Medications   losartan (COZAAR) 100 MG tablet      Meds ordered this encounter  Medications  . losartan (COZAAR) 100 MG tablet    Sig: Take 1 tablet (100 mg total) by mouth daily.    Dispense:  90 tablet    Refill:  1    Follow-up: Return in about 2 months (around 06/12/2018).   Advised patient to expect another 10 to 15% drop in  his blood pressure with the higher dosage.  He will check and record his pressures and follow-up in a month or 2.  Surgery  To be scheduled for sometime in February for his right inguinal hernia repair with mesh.

## 2018-04-30 HISTORY — PX: HERNIA REPAIR: SHX51

## 2018-06-06 ENCOUNTER — Encounter: Payer: Self-pay | Admitting: Family Medicine

## 2018-06-06 ENCOUNTER — Ambulatory Visit (INDEPENDENT_AMBULATORY_CARE_PROVIDER_SITE_OTHER): Payer: Managed Care, Other (non HMO) | Admitting: Family Medicine

## 2018-06-06 VITALS — BP 130/90 | HR 67 | Ht 69.0 in | Wt 169.0 lb

## 2018-06-06 DIAGNOSIS — I10 Essential (primary) hypertension: Secondary | ICD-10-CM

## 2018-06-06 MED ORDER — AMLODIPINE BESYLATE 5 MG PO TABS: 5.0000 mg | ORAL_TABLET | Freq: Every day | ORAL | 2 refills | Status: DC

## 2018-06-06 NOTE — Addendum Note (Signed)
Addended by: Rodrigo Ran on: 06/06/2018 04:26 PM   Modules accepted: Orders

## 2018-06-06 NOTE — Patient Instructions (Signed)
Amlodipine tablets  What is this medicine?  AMLODIPINE (am LOE di peen) is a calcium-channel blocker. It affects the amount of calcium found in your heart and muscle cells. This relaxes your blood vessels, which can reduce the amount of work the heart has to do. This medicine is used to lower high blood pressure. It is also used to prevent chest pain.  This medicine may be used for other purposes; ask your health care provider or pharmacist if you have questions.  COMMON BRAND NAME(S): Norvasc  What should I tell my health care provider before I take this medicine?  They need to know if you have any of these conditions:  -heart disease  -liver disease  -an unusual or allergic reaction to amlodipine, other medicines, foods, dyes, or preservatives  -pregnant or trying to get pregnant  -breast-feeding  How should I use this medicine?  Take this medicine by mouth with a glass of water. Follow the directions on the prescription label. You can take it with or without food. If it upsets your stomach, take it with food. Take your medicine at regular intervals. Do not take it more often than directed. Do not stop taking except on your doctor's advice.  Talk to your pediatrician regarding the use of this medicine in children. While this drug may be prescribed for children as young as 6 years for selected conditions, precautions do apply.  Patients over 65 years of age may have a stronger reaction and need a smaller dose.  Overdosage: If you think you have taken too much of this medicine contact a poison control center or emergency room at once.  NOTE: This medicine is only for you. Do not share this medicine with others.  What if I miss a dose?  If you miss a dose, take it as soon as you can. If it is almost time for your next dose, take only that dose. Do not take double or extra doses.  What may interact with this medicine?  Do not take this medicine with any of the following medications:  -tranylcypromine  This medicine  may also interact with the following medications:  -clarithromycin  -cyclosporine  -diltiazem  -itraconazole  -simvastatin  -tacrolimus  This list may not describe all possible interactions. Give your health care provider a list of all the medicines, herbs, non-prescription drugs, or dietary supplements you use. Also tell them if you smoke, drink alcohol, or use illegal drugs. Some items may interact with your medicine.  What should I watch for while using this medicine?  Visit your healthcare professional for regular checks on your progress. Check your blood pressure as directed. Ask your healthcare professional what your blood pressure should be and when you should contact him or her.  Do not treat yourself for coughs, colds, or pain while you are using this medicine without asking your healthcare professional for advice. Some medicines may increase your blood pressure.  You may get dizzy. Do not drive, use machinery, or do anything that needs mental alertness until you know how this medicine affects you. Do not stand or sit up quickly, especially if you are an older patient. This reduces the risk of dizzy or fainting spells. Avoid alcoholic drinks; they can make you dizzier.  What side effects may I notice from receiving this medicine?  Side effects that you should report to your doctor or health care professional as soon as possible:  -allergic reactions like skin rash, itching or hives; swelling of the face,   lips, or tongue  -fast, irregular heartbeat  -signs and symptoms of low blood pressure like dizziness; feeling faint or lightheaded, falls; unusually weak or tired  -swelling of ankles, feet, hands  Side effects that usually do not require medical attention (report these to your doctor or health care professional if they continue or are bothersome):  -dry mouth  -facial flushing  -headache  -stomach pain  -tiredness  This list may not describe all possible side effects. Call your doctor for medical advice  about side effects. You may report side effects to FDA at 1-800-FDA-1088.  Where should I keep my medicine?  Keep out of the reach of children.  Store at room temperature between 59 and 86 degrees F (15 and 30 degrees C).  Throw away any unused medicine after the expiration date.  NOTE: This sheet is a summary. It may not cover all possible information. If you have questions about this medicine, talk to your doctor, pharmacist, or health care provider.  © 2019 [employer]vier/Gold Standard (2017-11-08 15:07:10)

## 2018-06-06 NOTE — Progress Notes (Signed)
Established Patient Office Visit  Subjective:  Patient ID: Donald Hampton, male    DOB: 02-13-1961  Age: 58 y.o. MRN: [id number]  CC:  Chief Complaint  Patient presents with  . Follow-up    HPI Donald Hampton presents for follow-up of his hypertension.  Patient is tolerating the losartan.  His blood pressure continues to run in the 140s to 150s over 80s to 90s.  He is scheduled to have his hernia surgery on the 21st.  Left knee is not doing better.  Past Medical History:  Diagnosis Date  . Chicken pox   . Frequent headaches   . Hypertension   . Migraines     Past Surgical History:  Procedure Laterality Date  . TONSILLECTOMY      Family History  Problem Relation Age of Onset  . Arthritis Father   . Heart disease Maternal Grandfather   . Early death Paternal Grandmother   . Early death Paternal Grandfather     Social History   Socioeconomic History  . Marital status: Married    Spouse name: Not on file  . Number of children: Not on file  . Years of education: Not on file  . Highest education level: Not on file  Occupational History  . Not on file  Social Needs  . Financial resource strain: Not on file  . Food insecurity:    Worry: Not on file    Inability: Not on file  . Transportation needs:    Medical: Not on file    Non-medical: Not on file  Tobacco Use  . Smoking status: Never Smoker  . Smokeless tobacco: Never Used  Substance and Sexual Activity  . Alcohol use: Yes    Comment: rarely  . Drug use: No  . Sexual activity: Yes    Partners: Female  Lifestyle  . Physical activity:    Days per week: Not on file    Minutes per session: Not on file  . Stress: Not on file  Relationships  . Social connections:    Talks on phone: Not on file    Gets together: Not on file    Attends religious service: Not on file    Active member of club or organization: Not on file    Attends meetings of clubs or organizations: Not on file    Relationship status:  Not on file  . Intimate partner violence:    Fear of current or ex partner: Not on file    Emotionally abused: Not on file    Physically abused: Not on file    Forced sexual activity: Not on file  Other Topics Concern  . Not on file  Social History Narrative  . Not on file    Outpatient Medications Prior to Visit  Medication Sig Dispense Refill  . gabapentin (NEURONTIN) 300 MG capsule TAKE 1 CAPSULE BY MOUTH TWICE A DAY 180 capsule 0  . indomethacin (INDOCIN) 25 MG capsule Take 1-2 tablets every 8 hours with food as needed. 60 capsule 1  . losartan (COZAAR) 100 MG tablet Take 1 tablet (100 mg total) by mouth daily. 90 tablet 1   No facility-administered medications prior to visit.     Not on File  ROS Review of Systems  Constitutional: Negative for fatigue, fever and unexpected weight change.  Respiratory: Negative.   Cardiovascular: Negative.   Gastrointestinal: Negative.   Neurological: Negative for headaches.  Hematological: Does not bruise/bleed easily.  Psychiatric/Behavioral: Negative.  Objective:    Physical Exam  Constitutional: He is oriented to person, place, and time. He appears well-developed and well-nourished. No distress.  HENT:  Head: Normocephalic and atraumatic.  Right Ear: External ear normal.  Left Ear: External ear normal.  Eyes: Right eye exhibits no discharge. Left eye exhibits no discharge. No scleral icterus.  Neck: No JVD present. No tracheal deviation present.  Pulmonary/Chest: Effort normal. No stridor.  Neurological: He is alert and oriented to person, place, and time.  Skin: Skin is warm and dry. He is not diaphoretic.  Psychiatric: He has a normal mood and affect. His behavior is normal.    BP 130/90   Pulse 67   Ht 5\' 9"  (1.753 m)   Wt 169 lb (76.7 kg)   SpO2 98%   BMI 24.96 kg/m  Wt Readings from Last 3 Encounters:  06/06/18 169 lb (76.7 kg)  04/11/18 168 lb (76.2 kg)  03/13/18 162 lb 8 oz (73.7 kg)   BP Readings  from Last 3 Encounters:  06/06/18 130/90  04/11/18 136/80  03/13/18 128/70   Guideline developer:  UpToDate (see UpToDate for funding source) Date Released: June 2014  Health Maintenance Due  Topic Date Due  . Hepatitis C Screening  08/14/1960  . COLONOSCOPY  04/30/2018    There are no preventive care reminders to display for this patient.  Lab Results  Component Value Date   TSH 1.68 07/05/2017   Lab Results  Component Value Date   WBC 8.6 01/06/2018   HGB 13.9 01/06/2018   HCT 40.4 01/06/2018   MCV 88.1 01/06/2018   PLT 357.0 01/06/2018   Lab Results  Component Value Date   NA 137 01/06/2018   K 5.2 (H) 01/06/2018   CO2 28 01/06/2018   GLUCOSE 91 01/06/2018   BUN 14 01/06/2018   CREATININE 0.85 01/06/2018   BILITOT 0.6 07/05/2017   ALKPHOS 71 07/05/2017   AST 17 07/05/2017   ALT 15 07/05/2017   PROT 6.5 07/05/2017   ALBUMIN 4.5 07/05/2017   CALCIUM 9.7 01/06/2018   GFR 97.85 01/06/2018   Lab Results  Component Value Date   CHOL 141 07/05/2017   Lab Results  Component Value Date   HDL 59.90 07/05/2017   Lab Results  Component Value Date   LDLCALC 70 07/05/2017   Lab Results  Component Value Date   TRIG 58.0 07/05/2017   Lab Results  Component Value Date   CHOLHDL 2 07/05/2017   Lab Results  Component Value Date   HGBA1C 4.9 02/24/2016      Assessment & Plan:   Problem List Items Addressed This Visit      Cardiovascular and Mediastinum   Essential hypertension - Primary   Relevant Medications   amLODipine (NORVASC) 5 MG tablet      Meds ordered this encounter  Medications  . amLODipine (NORVASC) 5 MG tablet    Sig: Take 1 tablet (5 mg total) by mouth daily.    Dispense:  30 tablet    Refill:  2    Follow-up: Return in about 5 weeks (around 07/11/2018).   I measured her blood pressure of 140/78.  Have added Norvasc.  We will continue losartan.  Patient will have his hernia surgery on the 21st and follow-up in a few weeks after  that.

## 2018-07-11 ENCOUNTER — Ambulatory Visit: Payer: Managed Care, Other (non HMO) | Admitting: Family Medicine

## 2018-08-28 ENCOUNTER — Ambulatory Visit (INDEPENDENT_AMBULATORY_CARE_PROVIDER_SITE_OTHER): Payer: Managed Care, Other (non HMO) | Admitting: Family Medicine

## 2018-08-28 ENCOUNTER — Encounter: Payer: Self-pay | Admitting: Family Medicine

## 2018-08-28 VITALS — Ht 69.0 in

## 2018-08-28 DIAGNOSIS — I10 Essential (primary) hypertension: Secondary | ICD-10-CM | POA: Diagnosis not present

## 2018-08-28 NOTE — Progress Notes (Signed)
Virtual Visit via Video Note  I connected with Donald Hampton on 08/28/18 at  3:00 PM EDT by a video enabled telemedicine application and verified that I am speaking with the correct person using two identifiers.  Location: Patient: homeProvider:    I discussed the limitations of evaluation and management by telemedicine and the availability of in person appointments. The patient expressed understanding and agreed to proceed.  History of Present Illness:    Observations/Objective:   Assessment and Plan:   Follow Up Instructions:    I discussed the assessment and treatment plan with the patient. The patient was provided an opportunity to ask questions and all were answered. The patient agreed with the plan and demonstrated an understanding of the instructions.   The patient was advised to call back or seek an in-person evaluation if the symptoms worsen or if the condition fails to improve as anticipated.  I provided 15   Established Patient Office Visit  Subjective:  Patient ID: Donald Hampton, male    DOB: [birth year]  Age: 58 y.o. MRN: [id number]  CC:  Chief Complaint  Patient presents with  . Follow-up    HPI Donald Hampton presents for follow-up of his hypertension.  Patient has done well with the addition of the amlodipine to his losartan.  Pressures been running in the 130/70 range.  Tolerating both medicines well.  Denies lower extremity edema with the Norvasc.  Continues to achieve 10,000 steps daily between running and walking.  He is now working from home.  There is no air travel for him.  Has done well since his hernia surgery last month.  Past Medical History:  Diagnosis Date  . Chicken pox   . Frequent headaches   . Hypertension   . Migraines     Past Surgical History:  Procedure Laterality Date  . TONSILLECTOMY      Family History  Problem Relation Age of Onset  . Arthritis Father   . Heart disease Maternal Grandfather   . Early death  Paternal Grandmother   . Early death Paternal Grandfather     Social History   Socioeconomic History  . Marital status: Married    Spouse name: Not on file  . Number of children: Not on file  . Years of education: Not on file  . Highest education level: Not on file  Occupational History  . Not on file  Social Needs  . Financial resource strain: Not on file  . Food insecurity:    Worry: Not on file    Inability: Not on file  . Transportation needs:    Medical: Not on file    Non-medical: Not on file  Tobacco Use  . Smoking status: Never Smoker  . Smokeless tobacco: Never Used  Substance and Sexual Activity  . Alcohol use: Yes    Comment: rarely  . Drug use: No  . Sexual activity: Yes    Partners: Female  Lifestyle  . Physical activity:    Days per week: Not on file    Minutes per session: Not on file  . Stress: Not on file  Relationships  . Social connections:    Talks on phone: Not on file    Gets together: Not on file    Attends religious service: Not on file    Active member of club or organization: Not on file    Attends meetings of clubs or organizations: Not on file    Relationship status: Not on file  .  Intimate partner violence:    Fear of current or ex partner: Not on file    Emotionally abused: Not on file    Physically abused: Not on file    Forced sexual activity: Not on file  Other Topics Concern  . Not on file  Social History Narrative  . Not on file    Outpatient Medications Prior to Visit  Medication Sig Dispense Refill  . amLODipine (NORVASC) 5 MG tablet Take 1 tablet (5 mg total) by mouth daily. 30 tablet 2  . gabapentin (NEURONTIN) 300 MG capsule TAKE 1 CAPSULE BY MOUTH TWICE A DAY 180 capsule 0  . indomethacin (INDOCIN) 25 MG capsule Take 1-2 tablets every 8 hours with food as needed. 60 capsule 1  . losartan (COZAAR) 100 MG tablet Take 1 tablet (100 mg total) by mouth daily. 90 tablet 1   No facility-administered medications prior to  visit.     Not on File  ROS Review of Systems  Constitutional: Negative.   Respiratory: Negative.  Negative for chest tightness and shortness of breath.   Cardiovascular: Negative.  Negative for chest pain and leg swelling.  Gastrointestinal: Negative.   Neurological: Negative for headaches.  Psychiatric/Behavioral: Negative.       Objective:    Physical Exam  Constitutional: He is oriented to person, place, and time. He appears well-developed and well-nourished. No distress.  HENT:  Head: Normocephalic and atraumatic.  Right Ear: External ear normal.  Left Ear: External ear normal.  Eyes: Conjunctivae are normal. Right eye exhibits no discharge. Left eye exhibits no discharge. No scleral icterus.  Neck: No JVD present. No tracheal deviation present.  Pulmonary/Chest: Effort normal. No stridor.  Neurological: He is alert and oriented to person, place, and time.  Skin: He is not diaphoretic.  Psychiatric: He has a normal mood and affect. His behavior is normal.    Ht 5\' 9"  (1.753 m)   BMI 24.96 kg/m  Wt Readings from Last 3 Encounters:  06/06/18 169 lb (76.7 kg)  04/11/18 168 lb (76.2 kg)  03/13/18 162 lb 8 oz (73.7 kg)     Health Maintenance Due  Topic Date Due  . Hepatitis C Screening  1961-03-01  . COLONOSCOPY  04/30/2018    There are no preventive care reminders to display for this patient.  Lab Results  Component Value Date   TSH 1.68 07/05/2017   Lab Results  Component Value Date   WBC 8.6 01/06/2018   HGB 13.9 01/06/2018   HCT 40.4 01/06/2018   MCV 88.1 01/06/2018   PLT 357.0 01/06/2018   Lab Results  Component Value Date   NA 137 01/06/2018   K 5.2 (H) 01/06/2018   CO2 28 01/06/2018   GLUCOSE 91 01/06/2018   BUN 14 01/06/2018   CREATININE 0.85 01/06/2018   BILITOT 0.6 07/05/2017   ALKPHOS 71 07/05/2017   AST 17 07/05/2017   ALT 15 07/05/2017   PROT 6.5 07/05/2017   ALBUMIN 4.5 07/05/2017   CALCIUM 9.7 01/06/2018   GFR 97.85  01/06/2018   Lab Results  Component Value Date   CHOL 141 07/05/2017   Lab Results  Component Value Date   HDL 59.90 07/05/2017   Lab Results  Component Value Date   LDLCALC 70 07/05/2017   Lab Results  Component Value Date   TRIG 58.0 07/05/2017   Lab Results  Component Value Date   CHOLHDL 2 07/05/2017   Lab Results  Component Value Date   HGBA1C 4.9 02/24/2016  Assessment & Plan:   Problem List Items Addressed This Visit      Cardiovascular and Mediastinum   Essential hypertension - Primary      No orders of the defined types were placed in this encounter.   Follow-up: Return 2 months for follow up of bp and a physical.    Libby Maw, MD15 minutes of non-face-to-face time during this encounter.   Continue amlodipine and losartan.

## 2018-10-04 ENCOUNTER — Other Ambulatory Visit: Payer: Self-pay | Admitting: Family Medicine

## 2018-10-04 DIAGNOSIS — G44019 Episodic cluster headache, not intractable: Secondary | ICD-10-CM

## 2018-10-27 ENCOUNTER — Telehealth: Payer: Self-pay | Admitting: Behavioral Health

## 2018-10-27 NOTE — Telephone Encounter (Signed)

## 2018-10-28 ENCOUNTER — Other Ambulatory Visit: Payer: Self-pay

## 2018-10-28 ENCOUNTER — Encounter: Payer: Self-pay | Admitting: Family Medicine

## 2018-10-28 ENCOUNTER — Ambulatory Visit (INDEPENDENT_AMBULATORY_CARE_PROVIDER_SITE_OTHER): Payer: Managed Care, Other (non HMO) | Admitting: Family Medicine

## 2018-10-28 ENCOUNTER — Ambulatory Visit (INDEPENDENT_AMBULATORY_CARE_PROVIDER_SITE_OTHER): Payer: Managed Care, Other (non HMO)

## 2018-10-28 VITALS — BP 128/80 | HR 64 | Ht 69.0 in | Wt 167.1 lb

## 2018-10-28 DIAGNOSIS — Z0001 Encounter for general adult medical examination with abnormal findings: Secondary | ICD-10-CM | POA: Diagnosis not present

## 2018-10-28 DIAGNOSIS — M25562 Pain in left knee: Secondary | ICD-10-CM

## 2018-10-28 DIAGNOSIS — Z0000 Encounter for general adult medical examination without abnormal findings: Secondary | ICD-10-CM | POA: Diagnosis not present

## 2018-10-28 DIAGNOSIS — I10 Essential (primary) hypertension: Secondary | ICD-10-CM | POA: Diagnosis not present

## 2018-10-28 LAB — COMPREHENSIVE METABOLIC PANEL
ALT: 13 U/L (ref 0–53)
AST: 21 U/L (ref 0–37)
Albumin: 4.6 g/dL (ref 3.5–5.2)
Alkaline Phosphatase: 83 U/L (ref 39–117)
BUN: 14 mg/dL (ref 6–23)
CO2: 30 mEq/L (ref 19–32)
Calcium: 9.2 mg/dL (ref 8.4–10.5)
Chloride: 105 mEq/L (ref 96–112)
Creatinine, Ser: 0.83 mg/dL (ref 0.40–1.50)
GFR: 94.37 mL/min (ref >60.00)
Glucose, Bld: 89 mg/dL (ref 70–99)
Potassium: 3.7 mEq/L (ref 3.5–5.1)
Sodium: 142 mEq/L (ref 135–145)
Total Bilirubin: 0.6 mg/dL (ref 0.2–1.2)
Total Protein: 6.3 g/dL (ref 6.0–8.3)

## 2018-10-28 LAB — URINALYSIS, ROUTINE W REFLEX MICROSCOPIC
Bilirubin Urine: NEGATIVE
Ketones, ur: 15 — AB
Leukocytes,Ua: NEGATIVE
Nitrite: NEGATIVE
Specific Gravity, Urine: 1.02 (ref 1.000–1.030)
Total Protein, Urine: NEGATIVE
Urine Glucose: NEGATIVE
Urobilinogen, UA: 0.2 (ref 0.0–1.0)
pH: 7.5 (ref 5.0–8.0)

## 2018-10-28 LAB — LIPID PANEL
Cholesterol: 139 mg/dL (ref 0–200)
HDL: 55.3 mg/dL (ref >39.00)
LDL Cholesterol: 72 mg/dL (ref 0–99)
NonHDL: 83.68
Total CHOL/HDL Ratio: 3
Triglycerides: 59 mg/dL (ref 0.0–149.0)
VLDL: 11.8 mg/dL (ref 0.0–40.0)

## 2018-10-28 LAB — CBC
HCT: 43.2 % (ref 39.0–52.0)
Hemoglobin: 14.2 g/dL (ref 13.0–17.0)
MCHC: 32.8 g/dL (ref 30.0–36.0)
MCV: 91.6 fl (ref 78.0–100.0)
Platelets: 211 10*3/uL (ref 150.0–400.0)
RBC: 4.72 Mil/uL (ref 4.22–5.81)
RDW: 13.3 % (ref 11.5–15.5)
WBC: 5.8 10*3/uL (ref 4.0–10.5)

## 2018-10-28 LAB — PSA: PSA: 0.54 ng/mL (ref 0.10–4.00)

## 2018-10-28 MED ORDER — AMLODIPINE BESYLATE 5 MG PO TABS: 5.0000 mg | ORAL_TABLET | Freq: Every day | ORAL | 2 refills | Status: DC

## 2018-10-28 MED ORDER — LOSARTAN POTASSIUM 100 MG PO TABS: 100.0000 mg | ORAL_TABLET | Freq: Every day | ORAL | 1 refills | Status: DC

## 2018-10-28 NOTE — Patient Instructions (Signed)
Health Maintenance, Male Adopting a healthy lifestyle and getting preventive care are important in promoting health and wellness. Ask your health care provider about:  The right schedule for you to have regular tests and exams.  Things you can do on your own to prevent diseases and keep yourself healthy. What should I know about diet, weight, and exercise? Eat a healthy diet   Eat a diet that includes plenty of vegetables, fruits, low-fat dairy products, and lean protein.  Do not eat a lot of foods that are high in solid fats, added sugars, or sodium. Maintain a healthy weight Body mass index (BMI) is a measurement that can be used to identify possible weight problems. It estimates body fat based on height and weight. Your health care provider can help determine your BMI and help you achieve or maintain a healthy weight. Get regular exercise Get regular exercise. This is one of the most important things you can do for your health. Most adults should:  Exercise for at least 150 minutes each week. The exercise should increase your heart rate and make you sweat (moderate-intensity exercise).  Do strengthening exercises at least twice a week. This is in addition to the moderate-intensity exercise.  Spend less time sitting. Even light physical activity can be beneficial. Watch cholesterol and blood lipids Have your blood tested for lipids and cholesterol at 60 years of age, then have this test every 5 years. You may need to have your cholesterol levels checked more often if:  Your lipid or cholesterol levels are high.  You are older than 60 years of age.  You are at high risk for heart disease. What should I know about cancer screening? Many types of cancers can be detected early and may often be prevented. Depending on your health history and family history, you may need to have cancer screening at various ages. This may include screening for:  Colorectal cancer.  Prostate cancer.   Skin cancer.  Lung cancer. What should I know about heart disease, diabetes, and high blood pressure? Blood pressure and heart disease  High blood pressure causes heart disease and increases the risk of stroke. This is more likely to develop in people who have high blood pressure readings, are of African descent, or are overweight.  Talk with your health care provider about your target blood pressure readings.  Have your blood pressure checked: ? Every 3-5 years if you are 79-12 years of age. ? Every year if you are 26 years old or older.  If you are between the ages of 57 and 39 and are a current or former smoker, ask your health care provider if you should have a one-time screening for abdominal aortic aneurysm (AAA). Diabetes Have regular diabetes screenings. This checks your fasting blood sugar level. Have the screening done:  Once every three years after age 15 if you are at a normal weight and have a low risk for diabetes.  More often and at a younger age if you are overweight or have a high risk for diabetes. What should I know about preventing infection? Hepatitis B If you have a higher risk for hepatitis B, you should be screened for this virus. Talk with your health care provider to find out if you are at risk for hepatitis B infection. Hepatitis C Blood testing is recommended for:  Everyone born from [birth year] through 1967.  Anyone with known risk factors for hepatitis C. Sexually transmitted infections (STIs)  You should be screened each year  for STIs, including gonorrhea and chlamydia, if: ? You are sexually active and are younger than 60 years of age. ? You are older than 60 years of age and your health care provider tells you that you are at risk for this type of infection. ? Your sexual activity has changed since you were last screened, and you are at increased risk for chlamydia or gonorrhea. Ask your health care provider if you are at risk.  Ask your health care  provider about whether you are at high risk for HIV. Your health care provider may recommend a prescription medicine to help prevent HIV infection. If you choose to take medicine to prevent HIV, you should first get tested for HIV. You should then be tested every 3 months for as long as you are taking the medicine. Follow these instructions at home: Lifestyle  Do not use any products that contain nicotine or tobacco, such as cigarettes, e-cigarettes, and chewing tobacco. If you need help quitting, ask your health care provider.  Do not use street drugs.  Do not share needles.  Ask your health care provider for help if you need support or information about quitting drugs. Alcohol use  Do not drink alcohol if your health care provider tells you not to drink.  If you drink alcohol: ? Limit how much you have to 0-2 drinks a day. ? Be aware of how much alcohol is in your drink. In the [place] one drink equals one 12 oz bottle of beer (355 mL), one 5 oz glass of wine (148 mL), or one 1 oz glass of hard liquor (44 mL). General instructions  Schedule regular health, dental, and eye exams.  Stay current with your vaccines.  Tell your health care provider if: ? You often feel depressed. ? You have ever been abused or do not feel safe at home. Summary  Adopting a healthy lifestyle and getting preventive care are important in promoting health and wellness.  Follow your health care provider's instructions about healthy diet, exercising, and getting tested or screened for diseases.  Follow your health care provider's instructions on monitoring your cholesterol and blood pressure. This information is not intended to replace advice given to you by your health care provider. Make sure you discuss any questions you have with your health care provider. Document Released: 10/13/2007 Document Revised: 04/09/2018 Document Reviewed: 04/09/2018 Elsevier Patient Education  [employer].  Patellar  Tendinitis  Patellar tendinitis is also called jumper's knee or patellar tendinopathy. This condition happens when there is damage to and inflammation of the patellar tendon. Tendons are cord-like tissues that connect muscles to bones. The patellar tendon connects the bottom of the kneecap (patella) to the top of the shin bone (tibia). Patellar tendinitis causes pain in the front of the knee. The condition happens in the following stages:  Stage 1: In this stage, you have pain only after activity.  Stage 2: In this stage, you have pain during and after activity.  Stage 3: In this stage, you have pain at rest as well as during and after activity. The pain limits your ability to do the activity.  Stage 4: In this stage, the tendon tears and severely limits your activity. What are the causes? This condition is caused by repeated (repetitive) stress on the tendon. This stress may cause the tendon to stretch, swell, thicken, or tear. What increases the risk? The following factors may make you more likely to develop this condition:  Participating in sports that  involve running, kicking, and jumping, especially on hard surfaces. These include: ? Basketball. ? Volleyball. ? Soccer. ? Track and field.  Training too hard.  Having tight thigh muscles.  Having received steroid injections in the tendon.  Having had knee surgery.  Being 51-75 years old.  Having rheumatoid arthritis, diabetes, or kidney disease. These conditions interrupt blood flow to the knee, causing the tendon to weaken. What are the signs or symptoms? The main symptom of this condition is pain and swelling in the front of the knee. The pain usually starts slowly and gradually gets worse. It may become painful to straighten your leg. The pain may get worse when you walk, run, or jump. How is this diagnosed? This condition may be diagnosed based on:  Your symptoms.  Your medical history.  A physical exam. During the  physical exam, your health care provider may check for: ? Tenderness in your patella. ? Tightness in your thigh muscles. ? Pain when you straighten your knee.  Imaging tests, including: ? X-rays. These will show the position and condition of your patella. ? An MRI. This will show any abnormality of the tendon. ? Ultrasound. This will show any swelling or other abnormalities of the tendon. How is this treated? Treatment for this condition depends on the stage of the condition. It may involve:  Avoiding activities that cause pain, such as jumping.  Icing and elevating your knee.  Having sound wave stimulation to promote healing.  Doing stretching and strengthening exercises (physical therapy) when pain and swelling improve.  Wearing a knee brace. This may be needed if your condition does not improve with treatment.  Using crutches or a walker. This may be needed if your condition does not improve with treatment.  Surgery. This may be done if you have stage 4 tendinitis. Follow these instructions at home: If you have a brace:  Wear the brace as told by your health care provider. Remove it only as told by your health care provider.  Loosen the brace if your toes tingle, become numb, or turn cold and blue.  Keep the brace clean.  If the brace is not waterproof: ? Do not let it get wet. ? Cover it with a watertight covering when you take a bath or shower.  Ask your health care provider when it is safe for you to drive. Managing pain, stiffness, and swelling   If directed, put ice on the injured area. ? If you have a removable brace, remove it as told by your health care provider. ? Put ice in a plastic bag. ? Place a towel between your skin and the bag. ? Leave the ice on for 20 minutes, 2-3 times a day.  Move your toes often to reduce stiffness and swelling.  Raise (elevate) your knee above the level of your heart while you are sitting or lying down. Activity  Do not  use the injured limb to support your body weight until your health care provider says that you can. Use your crutches or a walker as told by your health care provider.  Return to your normal activities as told by your health care provider. Ask your health care provider what activities are safe for you.  Do exercises as told by your health care provider or physical therapist. General instructions  Take over-the-counter and prescription medicines only as told by your health care provider.  Do not use any products that contain nicotine or tobacco, such as cigarettes, e-cigarettes, and chewing tobacco.  These can delay healing. If you need help quitting, ask your health care provider.  Keep all follow-up visits as told by your health care provider. This is important. How is this prevented?  Warm up and stretch before being active.  Cool down and stretch after being active.  Give your body time to rest between periods of activity. ? You may need to reduce how often you play a sport that requires frequent jumping.  Make sure to use equipment that fits you.  Be safe and responsible while being active. This will help you avoid falls which can damage the tendon.  Do at least 150 minutes of moderate-intensity exercise each week, such as brisk walking or water aerobics.  Maintain physical fitness, including: ? Strength. ? Flexibility. ? Cardiovascular fitness. ? Endurance. Contact a health care provider if:  Your symptoms have not improved in 6 weeks.  Your symptoms get worse. Summary  Patellar tendinitis is also called jumper's knee or patellar tendinopathy. This condition happens when there is damage to and inflammation of the patellar tendon.  Treatment for this condition depends on the stage of the condition and may include rest, ice, exercises, medicines, and surgery.  Do not use the injured limb to support your body weight until your health care provider says that you can.   Take over-the-counter and prescription medicines only as told by your health care provider.  Keep all follow-up visits as told by your health care provider. This is important. This information is not intended to replace advice given to you by your health care provider. Make sure you discuss any questions you have with your health care provider. Document Released: 04/16/2005 Document Revised: 08/07/2018 Document Reviewed: 03/10/2018 Elsevier Patient Education  2020 [employer].

## 2018-10-28 NOTE — Progress Notes (Signed)
Established Patient Office Visit  Subjective:  Patient ID: Donald Hampton, male    DOB: Apr 15, 1961  Age: 58 y.o. MRN: [id number]  CC:  Chief Complaint  Patient presents with  . Annual Exam    HPI Donald Hampton presents for complete physical exam and follow-up of his hypertension and left knee pain.  Continues to work from home and is now traveling.  Blood pressures been well controlled with the addition of Norvasc.  He is tolerating both drugs well and denies swelling or edema in his lower extremities.  Left knee is gradually getting better.  Pain has been in the patellar tendon area.  He is gradually starting to run some.  He is staying active.  He does not smoke he rarely drinks alcohol.  Recovering well after his bilateral inguinal hernia repair.  Urine flow is good.  I will check his plan.  And he needs to make a dental appointment.  Last colonoscopy was 10 years ago and there were no polyps.  He is having no changes in his bowel or bladder functions.  Past Medical History:  Diagnosis Date  . Chicken pox   . Frequent headaches   . Hypertension   . Migraines     Past Surgical History:  Procedure Laterality Date  . TONSILLECTOMY      Family History  Problem Relation Age of Onset  . Arthritis Father   . Heart disease Maternal Grandfather   . Early death Paternal Grandmother   . Early death Paternal Grandfather     Social History   Socioeconomic History  . Marital status: Married    Spouse name: Not on file  . Number of children: Not on file  . Years of education: Not on file  . Highest education level: Not on file  Occupational History  . Not on file  Social Needs  . Financial resource strain: Not on file  . Food insecurity    Worry: Not on file    Inability: Not on file  . Transportation needs    Medical: Not on file    Non-medical: Not on file  Tobacco Use  . Smoking status: Never Smoker  . Smokeless tobacco: Never Used  Substance and Sexual Activity   . Alcohol use: Yes    Comment: rarely  . Drug use: No  . Sexual activity: Yes    Partners: Female  Lifestyle  . Physical activity    Days per week: Not on file    Minutes per session: Not on file  . Stress: Not on file  Relationships  . Social Herbalist on phone: Not on file    Gets together: Not on file    Attends religious service: Not on file    Active member of club or organization: Not on file    Attends meetings of clubs or organizations: Not on file    Relationship status: Not on file  . Intimate partner violence    Fear of current or ex partner: Not on file    Emotionally abused: Not on file    Physically abused: Not on file    Forced sexual activity: Not on file  Other Topics Concern  . Not on file  Social History Narrative  . Not on file    Outpatient Medications Prior to Visit  Medication Sig Dispense Refill  . gabapentin (NEURONTIN) 300 MG capsule TAKE 1 CAPSULE BY MOUTH TWICE A DAY 180 capsule 0  . indomethacin (INDOCIN)  25 MG capsule Take 1-2 tablets every 8 hours with food as needed. 60 capsule 1  . amLODipine (NORVASC) 5 MG tablet Take 1 tablet (5 mg total) by mouth daily. 30 tablet 2  . losartan (COZAAR) 100 MG tablet Take 1 tablet (100 mg total) by mouth daily. 90 tablet 1   No facility-administered medications prior to visit.     Not on File  ROS Review of Systems  Constitutional: Negative for chills, diaphoresis, fatigue, fever and unexpected weight change.  HENT: Negative.   Eyes: Negative for photophobia and visual disturbance.  Respiratory: Negative for shortness of breath and wheezing.   Cardiovascular: Negative for chest pain and leg swelling.  Gastrointestinal: Negative.  Negative for anal bleeding and blood in stool.  Endocrine: Negative for polyphagia and polyuria.  Genitourinary: Negative for difficulty urinating, frequency and urgency.  Musculoskeletal: Negative for gait problem and joint swelling.  Skin: Negative for  pallor and rash.  Allergic/Immunologic: Negative for immunocompromised state.  Neurological: Negative for light-headedness and numbness.  Hematological: Does not bruise/bleed easily.  Psychiatric/Behavioral: Negative.       Objective:    Physical Exam  Constitutional: He is oriented to person, place, and time. He appears well-developed and well-nourished. No distress.  HENT:  Head: Normocephalic and atraumatic.  Right Ear: External ear normal.  Left Ear: External ear normal.  Mouth/Throat: Oropharynx is clear and moist. No oropharyngeal exudate.  Eyes: Pupils are equal, round, and reactive to light. Conjunctivae are normal. Right eye exhibits no discharge. Left eye exhibits no discharge. No scleral icterus.  Neck: Neck supple. No JVD present. No tracheal deviation present. No thyromegaly present.  Cardiovascular: Normal rate, regular rhythm and normal heart sounds.  Pulmonary/Chest: Effort normal and breath sounds normal. No stridor.  Abdominal: Bowel sounds are normal. He exhibits no distension and no mass. There is no abdominal tenderness. There is no rebound and no guarding. Hernia confirmed negative in the right inguinal area and confirmed negative in the left inguinal area.  Genitourinary: Rectum:     Guaiac result negative.     No rectal mass, anal fissure, tenderness, external hemorrhoid, internal hemorrhoid or abnormal anal tone.  Prostate is not enlarged and not tender. Right testis shows no mass, no swelling and no tenderness. Right testis is descended. Left testis shows no mass, no swelling and no tenderness. Left testis is descended. No hypospadias, penile erythema or penile tenderness. No discharge found.  Musculoskeletal:        General: No edema.  Lymphadenopathy:    He has no cervical adenopathy.       Right: No inguinal adenopathy present.       Left: No inguinal adenopathy present.  Neurological: He is alert and oriented to person, place, and time.  Skin: Skin is  warm and dry. No rash noted. He is not diaphoretic. No erythema.  Psychiatric: He has a normal mood and affect. His behavior is normal.    BP 128/80   Pulse 64   Ht 5\' 9"  (1.753 m)   Wt 167 lb 2 oz (75.8 kg)   SpO2 97%   BMI 24.68 kg/m  Wt Readings from Last 3 Encounters:  10/28/18 167 lb 2 oz (75.8 kg)  06/06/18 169 lb (76.7 kg)  04/11/18 168 lb (76.2 kg)   BP Readings from Last 3 Encounters:  10/28/18 128/80  06/06/18 130/90  04/11/18 136/80   Guideline developer:  UpToDate (see UpToDate for funding source) Date Released: June 2014  Health Maintenance Due  Topic Date Due  . Hepatitis C Screening  04-01-1961  . COLONOSCOPY  04/30/2018    There are no preventive care reminders to display for this patient.  Lab Results  Component Value Date   TSH 1.68 07/05/2017   Lab Results  Component Value Date   WBC 8.6 01/06/2018   HGB 13.9 01/06/2018   HCT 40.4 01/06/2018   MCV 88.1 01/06/2018   PLT 357.0 01/06/2018   Lab Results  Component Value Date   NA 137 01/06/2018   K 5.2 (H) 01/06/2018   CO2 28 01/06/2018   GLUCOSE 91 01/06/2018   BUN 14 01/06/2018   CREATININE 0.85 01/06/2018   BILITOT 0.6 07/05/2017   ALKPHOS 71 07/05/2017   AST 17 07/05/2017   ALT 15 07/05/2017   PROT 6.5 07/05/2017   ALBUMIN 4.5 07/05/2017   CALCIUM 9.7 01/06/2018   GFR 97.85 01/06/2018   Lab Results  Component Value Date   CHOL 141 07/05/2017   Lab Results  Component Value Date   HDL 59.90 07/05/2017   Lab Results  Component Value Date   LDLCALC 70 07/05/2017   Lab Results  Component Value Date   TRIG 58.0 07/05/2017   Lab Results  Component Value Date   CHOLHDL 2 07/05/2017   Lab Results  Component Value Date   HGBA1C 4.9 02/24/2016      Assessment & Plan:   Problem List Items Addressed This Visit      Cardiovascular and Mediastinum   Essential hypertension - Primary   Relevant Medications   amLODipine (NORVASC) 5 MG tablet   losartan (COZAAR) 100 MG  tablet   Other Relevant Orders   CBC   Comprehensive metabolic panel   Urinalysis, Routine w reflex microscopic    Other Visit Diagnoses    Health care maintenance       Relevant Orders   Lipid panel   PSA   Ambulatory referral to Gastroenterology   Hepatitis C antibody   Left knee pain, unspecified chronicity       Relevant Orders   DG Knee Complete 4 Views Left      Meds ordered this encounter  Medications  . amLODipine (NORVASC) 5 MG tablet    Sig: Take 1 tablet (5 mg total) by mouth daily.    Dispense:  90 tablet    Refill:  2  . losartan (COZAAR) 100 MG tablet    Sig: Take 1 tablet (100 mg total) by mouth daily.    Dispense:  90 tablet    Refill:  1    Follow-up: No follow-ups on file.   Patient was given information on health maintenance and disease prevention.  He will continue to gradually work back into his running routine.  He was given information on patellar tendinitis.  Suggested sports medicine referral if his knee does not continue to improve.  Continue amlodipine and Cozaar for blood pressure control.

## 2018-10-29 ENCOUNTER — Other Ambulatory Visit: Payer: Self-pay

## 2018-10-29 DIAGNOSIS — R319 Hematuria, unspecified: Secondary | ICD-10-CM

## 2018-10-29 LAB — HEPATITIS C ANTIBODY
Hepatitis C Ab: NONREACTIVE
SIGNAL TO CUT-OFF: 0.01 (ref <1.00)

## 2018-11-19 ENCOUNTER — Other Ambulatory Visit: Payer: Self-pay | Admitting: Family Medicine

## 2018-11-19 ENCOUNTER — Other Ambulatory Visit: Payer: Managed Care, Other (non HMO)

## 2018-11-19 DIAGNOSIS — I10 Essential (primary) hypertension: Secondary | ICD-10-CM

## 2018-11-20 ENCOUNTER — Telehealth: Payer: Self-pay

## 2018-11-20 ENCOUNTER — Other Ambulatory Visit: Payer: Self-pay | Admitting: Family Medicine

## 2018-11-20 DIAGNOSIS — I10 Essential (primary) hypertension: Secondary | ICD-10-CM

## 2018-11-20 NOTE — Telephone Encounter (Signed)

## 2018-11-21 ENCOUNTER — Other Ambulatory Visit (INDEPENDENT_AMBULATORY_CARE_PROVIDER_SITE_OTHER): Payer: Managed Care, Other (non HMO)

## 2018-11-21 ENCOUNTER — Ambulatory Visit (INDEPENDENT_AMBULATORY_CARE_PROVIDER_SITE_OTHER): Payer: Managed Care, Other (non HMO)

## 2018-11-21 VITALS — Resp 16 | Ht 69.0 in | Wt 167.0 lb

## 2018-11-21 DIAGNOSIS — R319 Hematuria, unspecified: Secondary | ICD-10-CM | POA: Diagnosis not present

## 2018-11-21 DIAGNOSIS — Z23 Encounter for immunization: Secondary | ICD-10-CM | POA: Diagnosis not present

## 2018-11-21 LAB — URINALYSIS, ROUTINE W REFLEX MICROSCOPIC
Bilirubin Urine: NEGATIVE
Hgb urine dipstick: NEGATIVE
Ketones, ur: NEGATIVE
Leukocytes,Ua: NEGATIVE
Nitrite: NEGATIVE
RBC / HPF: NONE SEEN (ref 0–?)
Specific Gravity, Urine: <=1.005 — AB (ref 1.000–1.030)
Total Protein, Urine: NEGATIVE
Urine Glucose: NEGATIVE
Urobilinogen, UA: 0.2 (ref 0.0–1.0)
pH: 7 (ref 5.0–8.0)

## 2018-11-21 NOTE — Addendum Note (Signed)
Addended by: Lynnea Ferrier on: 11/21/2018 08:20 AM   Modules accepted: Orders

## 2018-11-21 NOTE — Progress Notes (Signed)
After obtaining consent, and per orders of Dr. Ethelene Hal, injection of Shingrix immunization given by Sanjuana Letters CMA. Was given in Left Deltoid . Pt tolerated well.  Patient instructed to remain in clinic for 20 minutes afterwards, and to report any adverse reaction to me immediately.

## 2019-01-06 ENCOUNTER — Other Ambulatory Visit: Payer: Self-pay | Admitting: Family Medicine

## 2019-01-06 DIAGNOSIS — G44019 Episodic cluster headache, not intractable: Secondary | ICD-10-CM

## 2019-01-26 ENCOUNTER — Encounter: Payer: Self-pay | Admitting: Internal Medicine

## 2019-02-12 ENCOUNTER — Other Ambulatory Visit: Payer: Self-pay | Admitting: Family Medicine

## 2019-02-12 DIAGNOSIS — I10 Essential (primary) hypertension: Secondary | ICD-10-CM

## 2019-02-18 ENCOUNTER — Other Ambulatory Visit: Payer: Self-pay

## 2019-02-18 ENCOUNTER — Ambulatory Visit (AMBULATORY_SURGERY_CENTER): Payer: Managed Care, Other (non HMO) | Admitting: *Deleted

## 2019-02-18 VITALS — Temp 97.1°F | Ht 69.0 in | Wt 170.0 lb

## 2019-02-18 DIAGNOSIS — Z1159 Encounter for screening for other viral diseases: Secondary | ICD-10-CM

## 2019-02-18 DIAGNOSIS — Z1211 Encounter for screening for malignant neoplasm of colon: Secondary | ICD-10-CM

## 2019-02-18 MED ORDER — NA SULFATE-K SULFATE-MG SULF 17.5-3.13-1.6 GM/177ML PO SOLN: 1.0000 | Freq: Once | ORAL | 0 refills | Status: AC

## 2019-02-18 NOTE — Progress Notes (Signed)
No egg or soy allergy known to patient  No issues with past sedation with any surgeries  or procedures, no intubation problems  No diet pills per patient No home 02 use per patient  No blood thinners per patient  Pt denies issues with constipation  No A fib or A flutter  EMMI video sent to pt's e mail   Due to the COVID-19 pandemic we are asking patients to follow these guidelines. Please only bring one care partner. Please be aware that your care partner may wait in the car in the parking lot or if they feel like they will be too hot to wait in the car, they may wait in the lobby on the 4th floor. All care partners are required to wear a mask the entire time (we do not have any that we can provide them), they need to practice social distancing, and we will do a Covid check for all patient's and care partners when you arrive. Also we will check their temperature and your temperature. If the care partner waits in their car they need to stay in the parking lot the entire time and we will call them on their cell phone when the patient is ready for discharge so they can bring the car to the front of the building. Also all patient's will need to wear a mask into building.  suprep coupon provided  covid screening 03/03/19 ,8:30 am

## 2019-02-20 ENCOUNTER — Encounter: Payer: Self-pay | Admitting: Internal Medicine

## 2019-03-03 ENCOUNTER — Other Ambulatory Visit: Payer: Self-pay | Admitting: Internal Medicine

## 2019-03-03 LAB — SARS CORONAVIRUS 2 (TAT 6-24 HRS): SARS Coronavirus 2: NEGATIVE

## 2019-03-06 ENCOUNTER — Encounter: Payer: Self-pay | Admitting: Internal Medicine

## 2019-03-06 ENCOUNTER — Other Ambulatory Visit: Payer: Self-pay

## 2019-03-06 ENCOUNTER — Ambulatory Visit (AMBULATORY_SURGERY_CENTER): Payer: Managed Care, Other (non HMO) | Admitting: Internal Medicine

## 2019-03-06 VITALS — BP 115/75 | HR 54 | Temp 98.3°F | Resp 12 | Ht 69.0 in | Wt 170.0 lb

## 2019-03-06 DIAGNOSIS — D123 Benign neoplasm of transverse colon: Secondary | ICD-10-CM

## 2019-03-06 DIAGNOSIS — Z1211 Encounter for screening for malignant neoplasm of colon: Secondary | ICD-10-CM | POA: Diagnosis present

## 2019-03-06 MED ORDER — SODIUM CHLORIDE 0.9 % IV SOLN: 500.0000 mL | Freq: Once | INTRAVENOUS | Status: DC

## 2019-03-06 NOTE — Progress Notes (Signed)
Pt's states no medical or surgical changes since previsit or office visit.  Temp taken by LC VS taken by CW  

## 2019-03-06 NOTE — Patient Instructions (Signed)
One polyp removed and hemorrhoids noted today. Please read report and the following instructions.     YOU HAD AN ENDOSCOPIC PROCEDURE TODAY AT [facility]:   Refer to the procedure report that was given to you for any specific questions about what was found during the examination.  If the procedure report does not answer your questions, please call your gastroenterologist to clarify.  If you requested that your care partner not be given the details of your procedure findings, then the procedure report has been included in a sealed envelope for you to review at your convenience later.  YOU SHOULD EXPECT: Some feelings of bloating in the abdomen. Passage of more gas than usual.  Walking can help get rid of the air that was put into your GI tract during the procedure and reduce the bloating. If you had a lower endoscopy (such as a colonoscopy or flexible sigmoidoscopy) you may notice spotting of blood in your stool or on the toilet paper. If you underwent a bowel prep for your procedure, you may not have a normal bowel movement for a few days.  Please Note:  You might notice some irritation and congestion in your nose or some drainage.  This is from the oxygen used during your procedure.  There is no need for concern and it should clear up in a day or so.  SYMPTOMS TO REPORT IMMEDIATELY:   Following lower endoscopy (colonoscopy or flexible sigmoidoscopy):  Excessive amounts of blood in the stool  Significant tenderness or worsening of abdominal pains  Swelling of the abdomen that is new, acute  Fever of 100F or higher   For urgent or emergent issues, a gastroenterologist can be reached at any hour by calling [phone].   DIET:  We do recommend a small meal at first, but then you may proceed to your regular diet.  Drink plenty of fluids but you should avoid alcoholic beverages for 24 hours.  ACTIVITY:  You should plan to take it easy for the rest of today and you should  NOT DRIVE or use heavy machinery until tomorrow (because of the sedation medicines used during the test).    FOLLOW UP: Our staff will call the number listed on your records 48-72 hours following your procedure to check on you and address any questions or concerns that you may have regarding the information given to you following your procedure. If we do not reach you, we will leave a message.  We will attempt to reach you two times.  During this call, we will ask if you have developed any symptoms of COVID 19. If you develop any symptoms (ie: fever, flu-like symptoms, shortness of breath, cough etc.) before then, please call [phone].  If you test positive for Covid 19 in the 2 weeks post procedure, please call and report this information to [place].    If any biopsies were taken you will be contacted by phone or by letter within the next 1-3 weeks.  Please call us at [phone] if you have not heard about the biopsies in 3 weeks.    SIGNATURES/CONFIDENTIALITY: You and/or your care partner have signed paperwork which will be entered into your electronic medical record.  These signatures attest to the fact that that the information above on your After Visit Summary has been reviewed and is understood.  Full responsibility of the confidentiality of this discharge information lies with you and/or your care-partner.

## 2019-03-06 NOTE — Progress Notes (Signed)
Called to room to assist during endoscopic procedure.  Patient ID and intended procedure confirmed with present staff. Received instructions for my participation in the procedure from the performing physician.  

## 2019-03-06 NOTE — Op Note (Signed)
[facility] Patient Name: Donald Hampton Procedure Date: 03/06/2019 9:49 AM MRN: [id number] Endoscopist: Jerene Bears , MD Age: 58 Referring MD:  Date of Birth: [birth year] Gender: Male Account #: [id number] Procedure:                Colonoscopy Indications:              Screening for colorectal malignant neoplasm, Last                            colonoscopy 10 years ago Medicines:                Monitored Anesthesia Care Procedure:                Pre-Anesthesia Assessment:                           - Prior to the procedure, a History and Physical                            was performed, and patient medications and                            allergies were reviewed. The patient's tolerance of                            previous anesthesia was also reviewed. The risks                            and benefits of the procedure and the sedation                            options and risks were discussed with the patient.                            All questions were answered, and informed consent                            was obtained. Prior Anticoagulants: The patient has                            taken no previous anticoagulant or antiplatelet                            agents. ASA Grade Assessment: II - A patient with                            mild systemic disease. After reviewing the risks                            and benefits, the patient was deemed in                            satisfactory condition to undergo the procedure.  After obtaining informed consent, the colonoscope                            was passed under direct vision. Throughout the                            procedure, the patient's blood pressure, pulse, and                            oxygen saturations were monitored continuously. The                            Colonoscope was introduced through the anus and                            advanced to the cecum, identified by  appendiceal                            orifice and ileocecal valve. The colonoscopy was                            performed without difficulty. The patient tolerated                            the procedure well. The quality of the bowel                            preparation was good. The ileocecal valve,                            appendiceal orifice, and rectum were photographed. Scope In: 9:58:09 AM Scope Out: 10:16:12 AM Scope Withdrawal Time: 0 hours 9 minutes 58 seconds  Total Procedure Duration: 0 hours 18 minutes 3 seconds  Findings:                 The perianal and digital rectal examinations were                            normal.                           A 5 mm polyp was found in the splenic flexure. The                            polyp was sessile. The polyp was removed with a                            cold snare. Resection and retrieval were complete.                           Internal hemorrhoids were found during retroflexion.                           The exam was otherwise without abnormality. Complications:  No immediate complications. Estimated Blood Loss:     Estimated blood loss was minimal. Impression:               - One 5 mm polyp at the splenic flexure, removed                            with a cold snare. Resected and retrieved.                           - Internal hemorrhoids.                           - The examination was otherwise normal. Recommendation:           - Patient has a contact number available for                            emergencies. The signs and symptoms of potential                            delayed complications were discussed with the                            patient. Return to normal activities tomorrow.                            Written discharge instructions were provided to the                            patient.                           - Resume previous diet.                           - Continue present  medications.                           - Await pathology results.                           - Repeat colonoscopy is recommended. The                            colonoscopy date will be determined after pathology                            results from today's exam become available for                            review. Jerene Bears, MD 03/06/2019 10:18:42 AM This report has been signed electronically.

## 2019-03-06 NOTE — Progress Notes (Signed)
Pt tolerated well. VSS. Arousable and to recovery.

## 2019-03-10 ENCOUNTER — Telehealth: Payer: Self-pay

## 2019-03-10 NOTE — Telephone Encounter (Signed)
  Follow up Call-  Call back number 03/06/2019  Post procedure Call Back phone  # [phone]  Permission to leave phone message Yes     Patient questions:  Do you have a fever, pain , or abdominal swelling? No. Pain Score  0 *  Have you tolerated food without any problems? Yes.    Have you been able to return to your normal activities? Yes.    Do you have any questions about your discharge instructions: Diet   No. Medications  No. Follow up visit  No.  Do you have questions or concerns about your Care? No.  Actions: * If pain score is 4 or above: No action needed, pain <4.  1. Have you developed a fever since your procedure? no  2.   Have you had an respiratory symptoms (SOB or cough) since your procedure? no  3.   Have you tested positive for COVID 19 since your procedure? no  4.   Have you had any family members/close contacts diagnosed with the COVID 19 since your procedure?  no   If yes to any of these questions please route to Joylene John, RN and Alphonsa Gin, Therapist, sports.

## 2019-03-16 ENCOUNTER — Encounter: Payer: Self-pay | Admitting: Internal Medicine

## 2019-04-01 ENCOUNTER — Other Ambulatory Visit: Payer: Self-pay

## 2019-04-01 ENCOUNTER — Ambulatory Visit (INDEPENDENT_AMBULATORY_CARE_PROVIDER_SITE_OTHER): Payer: Managed Care, Other (non HMO)

## 2019-04-01 DIAGNOSIS — Z23 Encounter for immunization: Secondary | ICD-10-CM | POA: Diagnosis not present

## 2019-04-01 NOTE — Progress Notes (Signed)
Pt came into the office to receive his 2nd Shingrix vaccine. Vaccine given in the left deltoid, pt tolerated it well. No signs/symptoms of a reaction prior to pt leaving the office.

## 2019-04-10 ENCOUNTER — Other Ambulatory Visit: Payer: Self-pay

## 2019-04-10 ENCOUNTER — Other Ambulatory Visit: Payer: Self-pay | Admitting: Family Medicine

## 2019-04-10 DIAGNOSIS — G44019 Episodic cluster headache, not intractable: Secondary | ICD-10-CM

## 2019-04-27 ENCOUNTER — Other Ambulatory Visit: Payer: Self-pay

## 2019-04-28 ENCOUNTER — Ambulatory Visit (INDEPENDENT_AMBULATORY_CARE_PROVIDER_SITE_OTHER): Payer: Managed Care, Other (non HMO) | Admitting: Family Medicine

## 2019-04-28 ENCOUNTER — Encounter: Payer: Self-pay | Admitting: Family Medicine

## 2019-04-28 VITALS — BP 136/72 | HR 55 | Temp 97.9°F | Ht 69.0 in | Wt 168.4 lb

## 2019-04-28 DIAGNOSIS — I10 Essential (primary) hypertension: Secondary | ICD-10-CM

## 2019-04-28 LAB — BASIC METABOLIC PANEL
BUN: 15 mg/dL (ref 6–23)
CO2: 29 mEq/L (ref 19–32)
Calcium: 9.3 mg/dL (ref 8.4–10.5)
Chloride: 103 mEq/L (ref 96–112)
Creatinine, Ser: 0.77 mg/dL (ref 0.40–1.50)
GFR: 102.73 mL/min (ref >60.00)
Glucose, Bld: 92 mg/dL (ref 70–99)
Potassium: 4 mEq/L (ref 3.5–5.1)
Sodium: 139 mEq/L (ref 135–145)

## 2019-04-28 MED ORDER — LOSARTAN POTASSIUM 100 MG PO TABS: 100.0000 mg | ORAL_TABLET | Freq: Every day | ORAL | 2 refills | Status: DC

## 2019-04-28 MED ORDER — AMLODIPINE BESYLATE 5 MG PO TABS: 5.0000 mg | ORAL_TABLET | Freq: Every day | ORAL | 2 refills | Status: DC

## 2019-04-28 NOTE — Progress Notes (Signed)
Established Patient Office Visit  Subjective:  Patient ID: Donald Hampton, male    DOB: 1960-12-28  Age: 58 y.o. MRN: [id number]  CC:  Chief Complaint  Patient presents with  . Follow-up    6 month f/u on hypertention, no concerns pt states that BP has been great     HPI Donald Hampton presents for follow-up of his hyper tension.  Has done well with amlodipine and losartan.  He is now retired from work.  He has been exercising more and has been able to lose some weight.  Does not smoke or use illicit drugs.  Rarely drinks alcohol.  He has participated in a virtual 5K and done well.  Currently having no orthopedic issues at this time.  Up-to-date on his health maintenance.  Past Medical History:  Diagnosis Date  . Asthma    beginning left knee  . Chicken pox   . Chronic kidney disease   . Frequent headaches   . Hypertension   . Kidney stones   . Migraines     Past Surgical History:  Procedure Laterality Date  . COLONOSCOPY    . HERNIA REPAIR  2020  . LITHOTRIPSY    . REFRACTIVE SURGERY    . TONSILLECTOMY      Family History  Problem Relation Age of Onset  . Arthritis Father   . Heart disease Maternal Grandfather   . Early death Paternal Grandmother   . Early death Paternal Grandfather   . Colon cancer Neg Hx   . Colon polyps Neg Hx   . Esophageal cancer Neg Hx   . Rectal cancer Neg Hx   . Stomach cancer Neg Hx     Social History   Socioeconomic History  . Marital status: Married    Spouse name: Not on file  . Number of children: Not on file  . Years of education: Not on file  . Highest education level: Not on file  Occupational History  . Not on file  Tobacco Use  . Smoking status: Never Smoker  . Smokeless tobacco: Never Used  Substance and Sexual Activity  . Alcohol use: Yes    Comment: rarely  . Drug use: No  . Sexual activity: Yes    Partners: Female  Other Topics Concern  . Not on file  Social History Narrative  . Not on file   Social  Determinants of Health   Financial Resource Strain:   . Difficulty of Paying Living Expenses: Not on file  Food Insecurity:   . Worried About Charity fundraiser in the Last Year: Not on file  . Ran Out of Food in the Last Year: Not on file  Transportation Needs:   . Lack of Transportation (Medical): Not on file  . Lack of Transportation (Non-Medical): Not on file  Physical Activity:   . Days of Exercise per Week: Not on file  . Minutes of Exercise per Session: Not on file  Stress:   . Feeling of Stress : Not on file  Social Connections:   . Frequency of Communication with Friends and Family: Not on file  . Frequency of Social Gatherings with Friends and Family: Not on file  . Attends Religious Services: Not on file  . Active Member of Clubs or Organizations: Not on file  . Attends Archivist Meetings: Not on file  . Marital Status: Not on file  Intimate Partner Violence:   . Fear of Current or Ex-Partner: Not on file  .  Emotionally Abused: Not on file  . Physically Abused: Not on file  . Sexually Abused: Not on file    Outpatient Medications Prior to Visit  Medication Sig Dispense Refill  . gabapentin (NEURONTIN) 300 MG capsule TAKE 1 CAPSULE BY MOUTH TWICE A DAY 180 capsule 0  . indomethacin (INDOCIN) 25 MG capsule Take 1-2 tablets every 8 hours with food as needed. 60 capsule 1  . amLODipine (NORVASC) 5 MG tablet TAKE 1 TABLET BY MOUTH EVERY DAY 90 tablet 1  . losartan (COZAAR) 100 MG tablet TAKE 1 TABLET BY MOUTH EVERY DAY 90 tablet 1   No facility-administered medications prior to visit.    No Known Allergies  ROS Review of Systems  Constitutional: Negative.   HENT: Negative.   Eyes: Negative for photophobia and visual disturbance.  Respiratory: Negative.   Cardiovascular: Negative.   Gastrointestinal: Negative.   Endocrine: Negative for polyphagia and polyuria.  Genitourinary: Negative.   Musculoskeletal: Negative.   Allergic/Immunologic: Negative  for immunocompromised state.  Neurological: Negative for light-headedness, numbness and headaches.  Hematological: Does not bruise/bleed easily.  Psychiatric/Behavioral: Negative.       Objective:    Physical Exam  Constitutional: He is oriented to person, place, and time. He appears well-developed and well-nourished. No distress.  HENT:  Head: Normocephalic and atraumatic.  Right Ear: External ear normal.  Left Ear: External ear normal.  Mouth/Throat: Oropharynx is clear and moist.  Eyes: Conjunctivae are normal. Right eye exhibits no discharge. Left eye exhibits no discharge. No scleral icterus.  Neck: No JVD present. No tracheal deviation present. No thyromegaly present.  Cardiovascular: Normal rate, regular rhythm and normal heart sounds.  Pulmonary/Chest: Effort normal and breath sounds normal. No stridor.  Lymphadenopathy:    He has no cervical adenopathy.  Neurological: He is alert and oriented to person, place, and time.  Skin: Skin is warm and dry. He is not diaphoretic.  Psychiatric: He has a normal mood and affect. His behavior is normal.    BP 136/72   Pulse (!) 55   Temp 97.9 F (36.6 C) (Oral)   Ht 5\' 9"  (1.753 m)   Wt 168 lb 6.4 oz (76.4 kg)   SpO2 99%   BMI 24.87 kg/m  Wt Readings from Last 3 Encounters:  04/28/19 168 lb 6.4 oz (76.4 kg)  03/06/19 170 lb (77.1 kg)  02/18/19 170 lb (77.1 kg)     There are no preventive care reminders to display for this patient.  There are no preventive care reminders to display for this patient.  Lab Results  Component Value Date   TSH 1.68 07/05/2017   Lab Results  Component Value Date   WBC 5.8 10/28/2018   HGB 14.2 10/28/2018   HCT 43.2 10/28/2018   MCV 91.6 10/28/2018   PLT 211.0 10/28/2018   Lab Results  Component Value Date   NA 142 10/28/2018   K 3.7 10/28/2018   CO2 30 10/28/2018   GLUCOSE 89 10/28/2018   BUN 14 10/28/2018   CREATININE 0.83 10/28/2018   BILITOT 0.6 10/28/2018   ALKPHOS 83  10/28/2018   AST 21 10/28/2018   ALT 13 10/28/2018   PROT 6.3 10/28/2018   ALBUMIN 4.6 10/28/2018   CALCIUM 9.2 10/28/2018   GFR 94.37 10/28/2018   Lab Results  Component Value Date   CHOL 139 10/28/2018   Lab Results  Component Value Date   HDL 55.30 10/28/2018   Lab Results  Component Value Date   LDLCALC 72  10/28/2018   Lab Results  Component Value Date   TRIG 59.0 10/28/2018   Lab Results  Component Value Date   CHOLHDL 3 10/28/2018   Lab Results  Component Value Date   HGBA1C 4.9 02/24/2016      Assessment & Plan:   Problem List Items Addressed This Visit      Cardiovascular and Mediastinum   Essential hypertension - Primary   Relevant Medications   amLODipine (NORVASC) 5 MG tablet   losartan (COZAAR) 100 MG tablet   Other Relevant Orders   Basic Metabolic Panel (BMET)      Meds ordered this encounter  Medications  . amLODipine (NORVASC) 5 MG tablet    Sig: Take 1 tablet (5 mg total) by mouth daily.    Dispense:  90 tablet    Refill:  2  . losartan (COZAAR) 100 MG tablet    Sig: Take 1 tablet (100 mg total) by mouth daily.    Dispense:  90 tablet    Refill:  2    Follow-up: Return in about 6 months (around 10/27/2019), or for complete physical.    Libby Maw, MD

## 2019-07-11 ENCOUNTER — Other Ambulatory Visit: Payer: Self-pay | Admitting: Family Medicine

## 2019-07-11 DIAGNOSIS — G44019 Episodic cluster headache, not intractable: Secondary | ICD-10-CM

## 2019-10-10 ENCOUNTER — Other Ambulatory Visit: Payer: Self-pay | Admitting: Family Medicine

## 2019-10-10 DIAGNOSIS — G44019 Episodic cluster headache, not intractable: Secondary | ICD-10-CM

## 2019-10-27 ENCOUNTER — Encounter: Payer: Managed Care, Other (non HMO) | Admitting: Family Medicine

## 2019-10-28 ENCOUNTER — Other Ambulatory Visit: Payer: Self-pay

## 2019-10-29 ENCOUNTER — Ambulatory Visit (INDEPENDENT_AMBULATORY_CARE_PROVIDER_SITE_OTHER): Payer: Managed Care, Other (non HMO) | Admitting: Family Medicine

## 2019-10-29 ENCOUNTER — Encounter: Payer: Self-pay | Admitting: Family Medicine

## 2019-10-29 VITALS — BP 122/76 | HR 47 | Temp 96.9°F | Ht 69.0 in | Wt 167.6 lb

## 2019-10-29 DIAGNOSIS — Z0000 Encounter for general adult medical examination without abnormal findings: Secondary | ICD-10-CM

## 2019-10-29 DIAGNOSIS — I10 Essential (primary) hypertension: Secondary | ICD-10-CM | POA: Diagnosis not present

## 2019-10-29 LAB — COMPREHENSIVE METABOLIC PANEL
ALT: 12 U/L (ref 0–53)
AST: 18 U/L (ref 0–37)
Albumin: 4.7 g/dL (ref 3.5–5.2)
Alkaline Phosphatase: 84 U/L (ref 39–117)
BUN: 12 mg/dL (ref 6–23)
CO2: 31 mEq/L (ref 19–32)
Calcium: 9.5 mg/dL (ref 8.4–10.5)
Chloride: 104 mEq/L (ref 96–112)
Creatinine, Ser: 0.84 mg/dL (ref 0.40–1.50)
GFR: 92.76 mL/min (ref >60.00)
Glucose, Bld: 93 mg/dL (ref 70–99)
Potassium: 3.7 mEq/L (ref 3.5–5.1)
Sodium: 141 mEq/L (ref 135–145)
Total Bilirubin: 0.7 mg/dL (ref 0.2–1.2)
Total Protein: 6.4 g/dL (ref 6.0–8.3)

## 2019-10-29 LAB — PSA: PSA: 0.5 ng/mL (ref 0.10–4.00)

## 2019-10-29 LAB — URINALYSIS, ROUTINE W REFLEX MICROSCOPIC
Bilirubin Urine: NEGATIVE
Hgb urine dipstick: NEGATIVE
Ketones, ur: NEGATIVE
Leukocytes,Ua: NEGATIVE
Nitrite: NEGATIVE
RBC / HPF: NONE SEEN (ref 0–?)
Specific Gravity, Urine: <=1.005 — AB (ref 1.000–1.030)
Total Protein, Urine: NEGATIVE
Urine Glucose: NEGATIVE
Urobilinogen, UA: 0.2 (ref 0.0–1.0)
pH: 7.5 (ref 5.0–8.0)

## 2019-10-29 LAB — CBC
HCT: 42.2 % (ref 39.0–52.0)
Hemoglobin: 14.3 g/dL (ref 13.0–17.0)
MCHC: 33.8 g/dL (ref 30.0–36.0)
MCV: 90.7 fl (ref 78.0–100.0)
Platelets: 208 10*3/uL (ref 150.0–400.0)
RBC: 4.65 Mil/uL (ref 4.22–5.81)
RDW: 13.5 % (ref 11.5–15.5)
WBC: 4 10*3/uL (ref 4.0–10.5)

## 2019-10-29 LAB — LIPID PANEL
Cholesterol: 155 mg/dL (ref 0–200)
HDL: 65.1 mg/dL (ref >39.00)
LDL Cholesterol: 79 mg/dL (ref 0–99)
NonHDL: 89.46
Total CHOL/HDL Ratio: 2
Triglycerides: 52 mg/dL (ref 0.0–149.0)
VLDL: 10.4 mg/dL (ref 0.0–40.0)

## 2019-10-29 LAB — TSH: TSH: 1.23 u[IU]/mL (ref 0.35–4.50)

## 2019-10-29 NOTE — Patient Instructions (Signed)
Health Maintenance, Male Adopting a healthy lifestyle and getting preventive care are important in promoting health and wellness. Ask your health care provider about:  The right schedule for you to have regular tests and exams.  Things you can do on your own to prevent diseases and keep yourself healthy. What should I know about diet, weight, and exercise? Eat a healthy diet   Eat a diet that includes plenty of vegetables, fruits, low-fat dairy products, and lean protein.  Do not eat a lot of foods that are high in solid fats, added sugars, or sodium. Maintain a healthy weight Body mass index (BMI) is a measurement that can be used to identify possible weight problems. It estimates body fat based on height and weight. Your health care provider can help determine your BMI and help you achieve or maintain a healthy weight. Get regular exercise Get regular exercise. This is one of the most important things you can do for your health. Most adults should:  Exercise for at least 150 minutes each week. The exercise should increase your heart rate and make you sweat (moderate-intensity exercise).  Do strengthening exercises at least twice a week. This is in addition to the moderate-intensity exercise.  Spend less time sitting. Even light physical activity can be beneficial. Watch cholesterol and blood lipids Have your blood tested for lipids and cholesterol at 61 years of age, then have this test every 5 years. You may need to have your cholesterol levels checked more often if:  Your lipid or cholesterol levels are high.  You are older than 61 years of age.  You are at high risk for heart disease. What should I know about cancer screening? Many types of cancers can be detected early and may often be prevented. Depending on your health history and family history, you may need to have cancer screening at various ages. This may include screening for:  Colorectal cancer.  Prostate  cancer.  Skin cancer.  Lung cancer. What should I know about heart disease, diabetes, and high blood pressure? Blood pressure and heart disease  High blood pressure causes heart disease and increases the risk of stroke. This is more likely to develop in people who have high blood pressure readings, are of African descent, or are overweight.  Talk with your health care provider about your target blood pressure readings.  Have your blood pressure checked: ? Every 3-5 years if you are 18-39 years of age. ? Every year if you are 40 years old or older.  If you are between the ages of 65 and 75 and are a current or former smoker, ask your health care provider if you should have a one-time screening for abdominal aortic aneurysm (AAA). Diabetes Have regular diabetes screenings. This checks your fasting blood sugar level. Have the screening done:  Once every three years after age 45 if you are at a normal weight and have a low risk for diabetes.  More often and at a younger age if you are overweight or have a high risk for diabetes. What should I know about preventing infection? Hepatitis B If you have a higher risk for hepatitis B, you should be screened for this virus. Talk with your health care provider to find out if you are at risk for hepatitis B infection. Hepatitis C Blood testing is recommended for:  Everyone born from 1945 through 1965.  Anyone with known risk factors for hepatitis C. Sexually transmitted infections (STIs)  You should be screened each year   for STIs, including gonorrhea and chlamydia, if: ? You are sexually active and are younger than 61 years of age. ? You are older than 61 years of age and your health care provider tells you that you are at risk for this type of infection. ? Your sexual activity has changed since you were last screened, and you are at increased risk for chlamydia or gonorrhea. Ask your health care provider if you are at risk.  Ask your  health care provider about whether you are at high risk for HIV. Your health care provider may recommend a prescription medicine to help prevent HIV infection. If you choose to take medicine to prevent HIV, you should first get tested for HIV. You should then be tested every 3 months for as long as you are taking the medicine. Follow these instructions at home: Lifestyle  Do not use any products that contain nicotine or tobacco, such as cigarettes, e-cigarettes, and chewing tobacco. If you need help quitting, ask your health care provider.  Do not use street drugs.  Do not share needles.  Ask your health care provider for help if you need support or information about quitting drugs. Alcohol use  Do not drink alcohol if your health care provider tells you not to drink.  If you drink alcohol: ? Limit how much you have to 0-2 drinks a day. ? Be aware of how much alcohol is in your drink. In the [place] one drink equals one 12 oz bottle of beer (355 mL), one 5 oz glass of wine (148 mL), or one 1 oz glass of hard liquor (44 mL). General instructions  Schedule regular health, dental, and eye exams.  Stay current with your vaccines.  Tell your health care provider if: ? You often feel depressed. ? You have ever been abused or do not feel safe at home. Summary  Adopting a healthy lifestyle and getting preventive care are important in promoting health and wellness.  Follow your health care provider's instructions about healthy diet, exercising, and getting tested or screened for diseases.  Follow your health care provider's instructions on monitoring your cholesterol and blood pressure. This information is not intended to replace advice given to you by your health care provider. Make sure you discuss any questions you have with your health care provider. Document Revised: 04/09/2018 Document Reviewed: 04/09/2018 Elsevier Patient Education  [place].  Managing Your  Hypertension Hypertension is commonly called high blood pressure. This is when the force of your blood pressing against the walls of your arteries is too strong. Arteries are blood vessels that carry blood from your heart throughout your body. Hypertension forces the heart to work harder to pump blood, and may cause the arteries to become narrow or stiff. Having untreated or uncontrolled hypertension can cause heart attack, stroke, kidney disease, and other problems. What are blood pressure readings? A blood pressure reading consists of a higher number over a lower number. Ideally, your blood pressure should be below 120/80. The first ("top") number is called the systolic pressure. It is a measure of the pressure in your arteries as your heart beats. The second ("bottom") number is called the diastolic pressure. It is a measure of the pressure in your arteries as the heart relaxes. What does my blood pressure reading mean? Blood pressure is classified into four stages. Based on your blood pressure reading, your health care provider may use the following stages to determine what type of treatment you need, if any. Systolic  pressure and diastolic pressure are measured in a unit called mm Hg. Normal  Systolic pressure: below 263.  Diastolic pressure: below 80. Elevated  Systolic pressure: 785-885.  Diastolic pressure: below 80. Hypertension stage 1  Systolic pressure: 027-741.  Diastolic pressure: 28-78. Hypertension stage 2  Systolic pressure: 676 or above.  Diastolic pressure: 90 or above. What health risks are associated with hypertension? Managing your hypertension is an important responsibility. Uncontrolled hypertension can lead to:  A heart attack.  A stroke.  A weakened blood vessel (aneurysm).  Heart failure.  Kidney damage.  Eye damage.  Metabolic syndrome.  Memory and concentration problems. What changes can I make to manage my hypertension? Hypertension can be  managed by making lifestyle changes and possibly by taking medicines. Your health care provider will help you make a plan to bring your blood pressure within a normal range. Eating and drinking   Eat a diet that is high in fiber and potassium, and low in salt (sodium), added sugar, and fat. An example eating plan is called the DASH (Dietary Approaches to Stop Hypertension) diet. To eat this way: ? Eat plenty of fresh fruits and vegetables. Try to fill half of your plate at each meal with fruits and vegetables. ? Eat whole grains, such as whole wheat pasta, brown rice, or whole grain bread. Fill about one quarter of your plate with whole grains. ? Eat low-fat diary products. ? Avoid fatty cuts of meat, processed or cured meats, and poultry with skin. Fill about one quarter of your plate with lean proteins such as fish, chicken without skin, beans, eggs, and tofu. ? Avoid premade and processed foods. These tend to be higher in sodium, added sugar, and fat.  Reduce your daily sodium intake. Most people with hypertension should eat less than 1,500 mg of sodium a day.  Limit alcohol intake to no more than 1 drink a day for nonpregnant women and 2 drinks a day for men. One drink equals 12 oz of beer, 5 oz of wine, or 1 oz of hard liquor. Lifestyle  Work with your health care provider to maintain a healthy body weight, or to lose weight. Ask what an ideal weight is for you.  Get at least 30 minutes of exercise that causes your heart to beat faster (aerobic exercise) most days of the week. Activities may include walking, swimming, or biking.  Include exercise to strengthen your muscles (resistance exercise), such as weight lifting, as part of your weekly exercise routine. Try to do these types of exercises for 30 minutes at least 3 days a week.  Do not use any products that contain nicotine or tobacco, such as cigarettes and e-cigarettes. If you need help quitting, ask your health care  provider.  Control any long-term (chronic) conditions you have, such as high cholesterol or diabetes. Monitoring  Monitor your blood pressure at home as told by your health care provider. Your personal target blood pressure may vary depending on your medical conditions, your age, and other factors.  Have your blood pressure checked regularly, as often as told by your health care provider. Working with your health care provider  Review all the medicines you take with your health care provider because there may be side effects or interactions.  Talk with your health care provider about your diet, exercise habits, and other lifestyle factors that may be contributing to hypertension.  Visit your health care provider regularly. Your health care provider can help you create and adjust your  plan for managing hypertension. Will I need medicine to control my blood pressure? Your health care provider may prescribe medicine if lifestyle changes are not enough to get your blood pressure under control, and if:  Your systolic blood pressure is 130 or higher.  Your diastolic blood pressure is 80 or higher. Take medicines only as told by your health care provider. Follow the directions carefully. Blood pressure medicines must be taken as prescribed. The medicine does not work as well when you skip doses. Skipping doses also puts you at risk for problems. Contact a health care provider if:  You think you are having a reaction to medicines you have taken.  You have repeated (recurrent) headaches.  You feel dizzy.  You have swelling in your ankles.  You have trouble with your vision. Get help right away if:  You develop a severe headache or confusion.  You have unusual weakness or numbness, or you feel faint.  You have severe pain in your chest or abdomen.  You vomit repeatedly.  You have trouble breathing. Summary  Hypertension is when the force of blood pumping through your arteries is  too strong. If this condition is not controlled, it may put you at risk for serious complications.  Your personal target blood pressure may vary depending on your medical conditions, your age, and other factors. For most people, a normal blood pressure is less than 120/80.  Hypertension is managed by lifestyle changes, medicines, or both. Lifestyle changes include weight loss, eating a healthy, low-sodium diet, exercising more, and limiting alcohol. This information is not intended to replace advice given to you by your health care provider. Make sure you discuss any questions you have with your health care provider. Document Revised: 08/08/2018 Document Reviewed: 03/14/2016 Elsevier Patient Education  2020 [employer]  Preventive Care 65-21 Years Old, Male Preventive care refers to lifestyle choices and visits with your health care provider that can promote health and wellness. This includes:  A yearly physical exam. This is also called an annual well check.  Regular dental and eye exams.  Immunizations.  Screening for certain conditions.  Healthy lifestyle choices, such as eating a healthy diet, getting regular exercise, not using drugs or products that contain nicotine and tobacco, and limiting alcohol use. What can I expect for my preventive care visit? Physical exam Your health care provider will check:  Height and weight. These may be used to calculate body mass index (BMI), which is a measurement that tells if you are at a healthy weight.  Heart rate and blood pressure.  Your skin for abnormal spots. Counseling Your health care provider may ask you questions about:  Alcohol, tobacco, and drug use.  Emotional well-being.  Home and relationship well-being.  Sexual activity.  Eating habits.  Work and work Statistician. What immunizations do I need?  Influenza (flu) vaccine  This is recommended every year. Tetanus, diphtheria, and pertussis (Tdap) vaccine  You  may need a Td booster every 10 years. Varicella (chickenpox) vaccine  You may need this vaccine if you have not already been vaccinated. Zoster (shingles) vaccine  You may need this after age 70. Measles, mumps, and rubella (MMR) vaccine  You may need at least one dose of MMR if you were born in 1957 or later. You may also need a second dose. Pneumococcal conjugate (PCV13) vaccine  You may need this if you have certain conditions and were not previously vaccinated. Pneumococcal polysaccharide (PPSV23) vaccine  You may need one or  two doses if you smoke cigarettes or if you have certain conditions. Meningococcal conjugate (MenACWY) vaccine  You may need this if you have certain conditions. Hepatitis A vaccine  You may need this if you have certain conditions or if you travel or work in places where you may be exposed to hepatitis A. Hepatitis B vaccine  You may need this if you have certain conditions or if you travel or work in places where you may be exposed to hepatitis B. Haemophilus influenzae type b (Hib) vaccine  You may need this if you have certain risk factors. Human papillomavirus (HPV) vaccine  If recommended by your health care provider, you may need three doses over 6 months. You may receive vaccines as individual doses or as more than one vaccine together in one shot (combination vaccines). Talk with your health care provider about the risks and benefits of combination vaccines. What tests do I need? Blood tests  Lipid and cholesterol levels. These may be checked every 5 years, or more frequently if you are over 56 years old.  Hepatitis C test.  Hepatitis B test. Screening  Lung cancer screening. You may have this screening every year starting at age 45 if you have a 30-pack-year history of smoking and currently smoke or have quit within the past 15 years.  Prostate cancer screening. Recommendations will vary depending on your family history and other  risks.  Colorectal cancer screening. All adults should have this screening starting at age 69 and continuing until age 26. Your health care provider may recommend screening at age 4 if you are at increased risk. You will have tests every 1-10 years, depending on your results and the type of screening test.  Diabetes screening. This is done by checking your blood sugar (glucose) after you have not eaten for a while (fasting). You may have this done every 1-3 years.  Sexually transmitted disease (STD) testing. Follow these instructions at home: Eating and drinking  Eat a diet that includes fresh fruits and vegetables, whole grains, lean protein, and low-fat dairy products.  Take vitamin and mineral supplements as recommended by your health care provider.  Do not drink alcohol if your health care provider tells you not to drink.  If you drink alcohol: ? Limit how much you have to 0-2 drinks a day. ? Be aware of how much alcohol is in your drink. In the [place] one drink equals one 12 oz bottle of beer (355 mL), one 5 oz glass of wine (148 mL), or one 1 oz glass of hard liquor (44 mL). Lifestyle  Take daily care of your teeth and gums.  Stay active. Exercise for at least 30 minutes on 5 or more days each week.  Do not use any products that contain nicotine or tobacco, such as cigarettes, e-cigarettes, and chewing tobacco. If you need help quitting, ask your health care provider.  If you are sexually active, practice safe sex. Use a condom or other form of protection to prevent STIs (sexually transmitted infections).  Talk with your health care provider about taking a low-dose aspirin every day starting at age 90. What's next?  Go to your health care provider once a year for a well check visit.  Ask your health care provider how often you should have your eyes and teeth checked.  Stay up to date on all vaccines. This information is not intended to replace advice given to you by your  health care provider. Make sure you discuss any  questions you have with your health care provider. Document Revised: 04/10/2018 Document Reviewed: 04/10/2018 Elsevier Patient Education  2020 [employer].

## 2019-10-29 NOTE — Progress Notes (Addendum)
Established Patient Office Visit  Subjective:  Patient ID: Donald Hampton, male    DOB: December 25, 1960  Age: 59 y.o. MRN: [id number]  CC:  Chief Complaint  Patient presents with  . Annual Exam    CPE, no concerns.     HPI Donald Hampton presents for yearly physical and follow-up of hypertension.  Blood pressure well controlled with losartan and amlodipine.  Officially retired.  Busy working out and reading.  Some stress with interpersonal conflict with daughter.  Regular eye and dental care.  Past Medical History:  Diagnosis Date  . Asthma    beginning left knee  . Chicken pox   . Chronic kidney disease   . Frequent headaches   . Hypertension   . Kidney stones   . Migraines     Past Surgical History:  Procedure Laterality Date  . COLONOSCOPY    . HERNIA REPAIR  2020  . LITHOTRIPSY    . REFRACTIVE SURGERY    . TONSILLECTOMY      Family History  Problem Relation Age of Onset  . Arthritis Father   . Heart disease Maternal Grandfather   . Early death Paternal Grandmother   . Early death Paternal Grandfather   . Colon cancer Neg Hx   . Colon polyps Neg Hx   . Esophageal cancer Neg Hx   . Rectal cancer Neg Hx   . Stomach cancer Neg Hx     Social History   Socioeconomic History  . Marital status: Married    Spouse name: Not on file  . Number of children: Not on file  . Years of education: Not on file  . Highest education level: Not on file  Occupational History  . Not on file  Tobacco Use  . Smoking status: Never Smoker  . Smokeless tobacco: Never Used  Vaping Use  . Vaping Use: Never used  Substance and Sexual Activity  . Alcohol use: Yes    Comment: rarely  . Drug use: No  . Sexual activity: Yes    Partners: Female  Other Topics Concern  . Not on file  Social History Narrative  . Not on file   Social Determinants of Health   Financial Resource Strain:   . Difficulty of Paying Living Expenses:   Food Insecurity:   . Worried About Ship broker in the Last Year:   . Arboriculturist in the Last Year:   Transportation Needs:   . Film/video editor (Medical):   Marland Kitchen Lack of Transportation (Non-Medical):   Physical Activity:   . Days of Exercise per Week:   . Minutes of Exercise per Session:   Stress:   . Feeling of Stress :   Social Connections:   . Frequency of Communication with Friends and Family:   . Frequency of Social Gatherings with Friends and Family:   . Attends Religious Services:   . Active Member of Clubs or Organizations:   . Attends Archivist Meetings:   Marland Kitchen Marital Status:   Intimate Partner Violence:   . Fear of Current or Ex-Partner:   . Emotionally Abused:   Marland Kitchen Physically Abused:   . Sexually Abused:     Outpatient Medications Prior to Visit  Medication Sig Dispense Refill  . amLODipine (NORVASC) 5 MG tablet Take 1 tablet (5 mg total) by mouth daily. 90 tablet 2  . gabapentin (NEURONTIN) 300 MG capsule TAKE 1 CAPSULE BY MOUTH TWICE A DAY 180 capsule 0  .  losartan (COZAAR) 100 MG tablet Take 1 tablet (100 mg total) by mouth daily. 90 tablet 2  . indomethacin (INDOCIN) 25 MG capsule Take 1-2 tablets every 8 hours with food as needed. (Patient not taking: Reported on 10/29/2019) 60 capsule 1   No facility-administered medications prior to visit.    No Known Allergies  ROS Review of Systems  Constitutional: Negative.   HENT: Negative.   Eyes: Negative for photophobia and visual disturbance.  Respiratory: Negative.   Cardiovascular: Negative.   Gastrointestinal: Negative.   Endocrine: Negative for polyphagia and polyuria.  Genitourinary: Negative.  Negative for difficulty urinating, frequency and urgency.  Musculoskeletal: Negative for gait problem and joint swelling.  Skin: Negative for pallor and rash.  Allergic/Immunologic: Negative for immunocompromised state.  Neurological: Positive for headaches. Negative for speech difficulty.  Hematological: Negative.    Depression screen  [facility] 2/9 10/29/2019 10/29/2019 04/28/2019  Decreased Interest 0 0 0  Down, Depressed, Hopeless 1 0 0  PHQ - 2 Score 1 0 0  Altered sleeping 0 - -  Tired, decreased energy 0 - -  Change in appetite 0 - -  Feeling bad or failure about yourself  0 - -  Trouble concentrating 0 - -  Moving slowly or fidgety/restless 0 - -  Suicidal thoughts 0 - -  PHQ-9 Score 1 - -  Difficult doing work/chores Not difficult at all - -      Objective:    Physical Exam Vitals and nursing note reviewed.  Constitutional:      General: He is not in acute distress.    Appearance: Normal appearance. He is normal weight. He is not ill-appearing, toxic-appearing or diaphoretic.  HENT:     Head: Normocephalic and atraumatic.     Right Ear: Tympanic membrane, ear canal and external ear normal.     Left Ear: Tympanic membrane, ear canal and external ear normal.     Nose: Nose normal.  Eyes:     General:        Right eye: No discharge.        Left eye: No discharge.     Extraocular Movements: Extraocular movements intact.     Conjunctiva/sclera: Conjunctivae normal.     Pupils: Pupils are equal, round, and reactive to light.  Cardiovascular:     Rate and Rhythm: Normal rate and regular rhythm.  Pulmonary:     Effort: Pulmonary effort is normal.     Breath sounds: Normal breath sounds.  Abdominal:     General: Abdomen is flat. Bowel sounds are normal. There is no distension.     Palpations: Abdomen is soft. There is no mass.     Tenderness: There is no abdominal tenderness. There is no guarding or rebound.     Hernia: No hernia is present.  Genitourinary:    Prostate: Enlarged. Not tender and no nodules present.     Rectum: Guaiac result negative. No mass, tenderness, anal fissure, external hemorrhoid or internal hemorrhoid. Normal anal tone.  Musculoskeletal:     Cervical back: No rigidity or tenderness.     Right lower leg: No edema.     Left lower leg: No edema.  Lymphadenopathy:     Cervical: No  cervical adenopathy.  Skin:    General: Skin is warm and dry.  Neurological:     Mental Status: He is alert and oriented to person, place, and time.  Psychiatric:        Mood and Affect: Mood normal.  Behavior: Behavior normal.     BP 122/76   Pulse (!) 47   Temp (!) 96.9 F (36.1 C) (Tympanic)   Ht 5\' 9"  (1.753 m)   Wt 167 lb 9.6 oz (76 kg)   SpO2 97%   BMI 24.75 kg/m  Wt Readings from Last 3 Encounters:  10/29/19 167 lb 9.6 oz (76 kg)  04/28/19 168 lb 6.4 oz (76.4 kg)  03/06/19 170 lb (77.1 kg)     There are no preventive care reminders to display for this patient.  There are no preventive care reminders to display for this patient.  Lab Results  Component Value Date   TSH 1.68 07/05/2017   Lab Results  Component Value Date   WBC 5.8 10/28/2018   HGB 14.2 10/28/2018   HCT 43.2 10/28/2018   MCV 91.6 10/28/2018   PLT 211.0 10/28/2018   Lab Results  Component Value Date   NA 139 04/28/2019   K 4.0 04/28/2019   CO2 29 04/28/2019   GLUCOSE 92 04/28/2019   BUN 15 04/28/2019   CREATININE 0.77 04/28/2019   BILITOT 0.6 10/28/2018   ALKPHOS 83 10/28/2018   AST 21 10/28/2018   ALT 13 10/28/2018   PROT 6.3 10/28/2018   ALBUMIN 4.6 10/28/2018   CALCIUM 9.3 04/28/2019   GFR 102.73 04/28/2019   Lab Results  Component Value Date   CHOL 139 10/28/2018   Lab Results  Component Value Date   HDL 55.30 10/28/2018   Lab Results  Component Value Date   LDLCALC 72 10/28/2018   Lab Results  Component Value Date   TRIG 59.0 10/28/2018   Lab Results  Component Value Date   CHOLHDL 3 10/28/2018   Lab Results  Component Value Date   HGBA1C 4.9 02/24/2016      Assessment & Plan:   Problem List Items Addressed This Visit      Cardiovascular and Mediastinum   Essential hypertension - Primary   Relevant Orders   Comprehensive metabolic panel   CBC   Urinalysis, Routine w reflex microscopic     Other   Healthcare maintenance   Relevant Orders     Lipid panel   PSA   TSH      No orders of the defined types were placed in this encounter.   Follow-up: Return in about 1 year (around 10/28/2020), or if symptoms worsen or fail to improve.   Information given on health maintenance disease prevention and management of hypertension.  Bottom line for advice is to stay active. Libby Maw, MD

## 2019-11-04 ENCOUNTER — Other Ambulatory Visit: Payer: Self-pay | Admitting: Family Medicine

## 2019-11-04 DIAGNOSIS — I10 Essential (primary) hypertension: Secondary | ICD-10-CM

## 2019-11-23 ENCOUNTER — Telehealth: Payer: Self-pay | Admitting: Family Medicine

## 2019-11-23 NOTE — Telephone Encounter (Signed)
Patient dropped off Health Report paperwork for Dr. Ethelene Hal to sign. It is in his folder in the front office.

## 2019-12-02 NOTE — Telephone Encounter (Signed)
Spoke with patient who verbally understood forms are waiting to be signed. Will give his a call when there are finished

## 2019-12-02 NOTE — Telephone Encounter (Signed)
Patient came by the office to check the status of the forms that he dropped off. Did not see anything up front for patient. Please give him a call back at [phone] and update.

## 2019-12-29 ENCOUNTER — Other Ambulatory Visit: Payer: Self-pay | Admitting: Family Medicine

## 2019-12-29 DIAGNOSIS — G44019 Episodic cluster headache, not intractable: Secondary | ICD-10-CM

## 2020-01-21 ENCOUNTER — Other Ambulatory Visit: Payer: Self-pay

## 2020-01-21 DIAGNOSIS — I10 Essential (primary) hypertension: Secondary | ICD-10-CM

## 2020-01-21 DIAGNOSIS — G44019 Episodic cluster headache, not intractable: Secondary | ICD-10-CM

## 2020-01-21 MED ORDER — AMLODIPINE BESYLATE 5 MG PO TABS: 5.0000 mg | ORAL_TABLET | Freq: Every day | ORAL | 2 refills | Status: DC

## 2020-01-21 MED ORDER — LOSARTAN POTASSIUM 100 MG PO TABS: 100.0000 mg | ORAL_TABLET | Freq: Every day | ORAL | 2 refills | Status: DC

## 2020-01-26 ENCOUNTER — Other Ambulatory Visit: Payer: Self-pay

## 2020-01-26 DIAGNOSIS — G44019 Episodic cluster headache, not intractable: Secondary | ICD-10-CM

## 2020-01-26 MED ORDER — GABAPENTIN 300 MG PO CAPS: 300.0000 mg | ORAL_CAPSULE | Freq: Two times a day (BID) | ORAL | 0 refills | Status: DC

## 2020-04-20 ENCOUNTER — Encounter: Payer: Self-pay | Admitting: Family Medicine

## 2020-09-01 ENCOUNTER — Other Ambulatory Visit: Payer: Self-pay | Admitting: Family Medicine

## 2020-09-01 DIAGNOSIS — I10 Essential (primary) hypertension: Secondary | ICD-10-CM

## 2020-11-01 ENCOUNTER — Encounter: Payer: Self-pay | Admitting: Family Medicine

## 2020-11-01 ENCOUNTER — Other Ambulatory Visit: Payer: Self-pay

## 2020-11-01 ENCOUNTER — Ambulatory Visit (INDEPENDENT_AMBULATORY_CARE_PROVIDER_SITE_OTHER): Payer: 59 | Admitting: Family Medicine

## 2020-11-01 VITALS — BP 126/80 | HR 57 | Temp 97.3°F | Ht 69.0 in | Wt 167.2 lb

## 2020-11-01 DIAGNOSIS — L300 Nummular dermatitis: Secondary | ICD-10-CM | POA: Diagnosis not present

## 2020-11-01 DIAGNOSIS — Z125 Encounter for screening for malignant neoplasm of prostate: Secondary | ICD-10-CM

## 2020-11-01 DIAGNOSIS — I10 Essential (primary) hypertension: Secondary | ICD-10-CM

## 2020-11-01 DIAGNOSIS — Z0000 Encounter for general adult medical examination without abnormal findings: Secondary | ICD-10-CM | POA: Diagnosis not present

## 2020-11-01 LAB — CBC
HCT: 42.1 % (ref 39.0–52.0)
Hemoglobin: 14.1 g/dL (ref 13.0–17.0)
MCHC: 33.6 g/dL (ref 30.0–36.0)
MCV: 88.3 fl (ref 78.0–100.0)
Platelets: 208 10*3/uL (ref 150.0–400.0)
RBC: 4.76 Mil/uL (ref 4.22–5.81)
RDW: 13.3 % (ref 11.5–15.5)
WBC: 4.7 10*3/uL (ref 4.0–10.5)

## 2020-11-01 LAB — COMPREHENSIVE METABOLIC PANEL
ALT: 12 U/L (ref 0–53)
AST: 16 U/L (ref 0–37)
Albumin: 4.3 g/dL (ref 3.5–5.2)
Alkaline Phosphatase: 79 U/L (ref 39–117)
BUN: 13 mg/dL (ref 6–23)
CO2: 31 mEq/L (ref 19–32)
Calcium: 9.3 mg/dL (ref 8.4–10.5)
Chloride: 104 mEq/L (ref 96–112)
Creatinine, Ser: 0.89 mg/dL (ref 0.40–1.50)
GFR: 91.89 mL/min (ref >60.00)
Glucose, Bld: 93 mg/dL (ref 70–99)
Potassium: 3.6 mEq/L (ref 3.5–5.1)
Sodium: 141 mEq/L (ref 135–145)
Total Bilirubin: 0.6 mg/dL (ref 0.2–1.2)
Total Protein: 6.2 g/dL (ref 6.0–8.3)

## 2020-11-01 LAB — URINALYSIS, ROUTINE W REFLEX MICROSCOPIC
Bilirubin Urine: NEGATIVE
Ketones, ur: NEGATIVE
Leukocytes,Ua: NEGATIVE
Nitrite: NEGATIVE
Specific Gravity, Urine: <=1.005 — AB (ref 1.000–1.030)
Total Protein, Urine: NEGATIVE
Urine Glucose: NEGATIVE
Urobilinogen, UA: 0.2 (ref 0.0–1.0)
pH: 7 (ref 5.0–8.0)

## 2020-11-01 LAB — LIPID PANEL
Cholesterol: 144 mg/dL (ref 0–200)
HDL: 57.2 mg/dL (ref >39.00)
LDL Cholesterol: 77 mg/dL (ref 0–99)
NonHDL: 86.73
Total CHOL/HDL Ratio: 3
Triglycerides: 49 mg/dL (ref 0.0–149.0)
VLDL: 9.8 mg/dL (ref 0.0–40.0)

## 2020-11-01 LAB — PSA: PSA: 0.52 ng/mL (ref 0.10–4.00)

## 2020-11-01 NOTE — Progress Notes (Signed)
Established Patient Office Visit  Subjective:  Patient ID: Donald Hampton, male    DOB: 1960/10/05  Age: 60 y.o. MRN: [id number]  CC:  Chief Complaint  Patient presents with   Annual Exam    CPE, no concerns. Patient fasting for labs.     HPI Donald Hampton presents for for physical exam and follow-up of hypertension that is well controlled with amlodipine and losartan.  Experiencing some nocturia 1-2 times nightly.  Occasional right anterior abdominal pain when bending and stooping.  He is moving his bowels normally without blood or constipation denies difficulty with urination.  Recent Mohs surgery on right ear.  Past Medical History:  Diagnosis Date   Asthma    beginning left knee   Chicken pox    Chronic kidney disease    Frequent headaches    Hypertension    Kidney stones    Migraines     Past Surgical History:  Procedure Laterality Date   COLONOSCOPY     HERNIA REPAIR  2020   LITHOTRIPSY     REFRACTIVE SURGERY     TONSILLECTOMY      Family History  Problem Relation Age of Onset   Heart disease Mother    Kidney disease Father    Arthritis Father    Heart disease Maternal Grandfather    Early death Paternal Grandmother    Early death Paternal Grandfather    Colon cancer Neg Hx    Colon polyps Neg Hx    Esophageal cancer Neg Hx    Rectal cancer Neg Hx    Stomach cancer Neg Hx     Social History   Socioeconomic History   Marital status: Married    Spouse name: Not on file   Number of children: Not on file   Years of education: Not on file   Highest education level: Not on file  Occupational History   Not on file  Tobacco Use   Smoking status: Never   Smokeless tobacco: Never  Vaping Use   Vaping Use: Never used  Substance and Sexual Activity   Alcohol use: Yes    Comment: rarely   Drug use: No   Sexual activity: Yes    Partners: Female  Other Topics Concern   Not on file  Social History Narrative   Not on file   Social Determinants  of Health   Financial Resource Strain: Not on file  Food Insecurity: Not on file  Transportation Needs: Not on file  Physical Activity: Not on file  Stress: Not on file  Social Connections: Not on file  Intimate Partner Violence: Not on file    Outpatient Medications Prior to Visit  Medication Sig Dispense Refill   amLODipine (NORVASC) 5 MG tablet TAKE 1 TABLET BY MOUTH DAILY 90 tablet 2   losartan (COZAAR) 100 MG tablet TAKE 1 TABLET BY MOUTH DAILY 90 tablet 2   gabapentin (NEURONTIN) 300 MG capsule Take 1 capsule (300 mg total) by mouth 2 (two) times daily. (Patient not taking: Reported on 11/01/2020) 180 capsule 0   indomethacin (INDOCIN) 25 MG capsule Take 1-2 tablets every 8 hours with food as needed. (Patient not taking: No sig reported) 60 capsule 1   No facility-administered medications prior to visit.    No Known Allergies  ROS Review of Systems  Constitutional: Negative.   HENT: Negative.    Eyes:  Negative for photophobia and visual disturbance.  Respiratory: Negative.    Cardiovascular: Negative.   Gastrointestinal:  Negative for anal bleeding, blood in stool, constipation, diarrhea, nausea and rectal pain.  Genitourinary:  Negative for difficulty urinating, frequency and urgency.  Musculoskeletal:  Negative for gait problem and joint swelling.  Neurological:  Negative for speech difficulty and weakness.  Hematological:  Does not bruise/bleed easily.  Psychiatric/Behavioral: Negative.       Objective:    Physical Exam Vitals and nursing note reviewed.  Constitutional:      General: He is not in acute distress.    Appearance: Normal appearance. He is normal weight. He is not ill-appearing, toxic-appearing or diaphoretic.  HENT:     Head: Normocephalic and atraumatic.     Right Ear: Tympanic membrane, ear canal and external ear normal.     Left Ear: Tympanic membrane, ear canal and external ear normal.     Mouth/Throat:     Mouth: Mucous membranes are moist.      Pharynx: Oropharynx is clear. No oropharyngeal exudate or posterior oropharyngeal erythema.  Eyes:     General: No scleral icterus.       Right eye: No discharge.        Left eye: No discharge.     Extraocular Movements: Extraocular movements intact.     Conjunctiva/sclera: Conjunctivae normal.     Pupils: Pupils are equal, round, and reactive to light.  Neck:     Vascular: No carotid bruit.  Cardiovascular:     Rate and Rhythm: Normal rate and regular rhythm.  Pulmonary:     Effort: Pulmonary effort is normal.     Breath sounds: Normal breath sounds.  Abdominal:     General: Abdomen is flat. Bowel sounds are normal. There is no distension.     Palpations: Abdomen is soft. There is no mass.     Tenderness: There is no abdominal tenderness. There is no guarding or rebound.     Hernia: No hernia is present. There is no hernia in the left inguinal area or right inguinal area.  Genitourinary:    Penis: Circumcised. No hypospadias, erythema, tenderness, discharge, swelling or lesions.      Testes:        Right: Mass, tenderness or swelling not present. Right testis is descended.        Left: Mass, tenderness or swelling not present. Left testis is descended.     Epididymis:     Right: Not inflamed or enlarged. No mass.     Left: Not inflamed or enlarged. No mass.     Prostate: Enlarged. Not tender and no nodules present.     Rectum: Guaiac result negative. No mass, tenderness, anal fissure, external hemorrhoid or internal hemorrhoid. Normal anal tone.  Musculoskeletal:     Cervical back: Normal range of motion and neck supple. No rigidity or tenderness.     Right lower leg: No edema.     Left lower leg: No edema.  Lymphadenopathy:     Cervical: No cervical adenopathy.     Lower Body: No right inguinal adenopathy. No left inguinal adenopathy.  Skin:    General: Skin is warm and dry.       Neurological:     Mental Status: He is alert and oriented to person, place, and time.     BP 126/80   Pulse (!) 57   Temp (!) 97.3 F (36.3 C) (Temporal)   Ht 5\' 9"  (1.753 m)   Wt 167 lb 3.2 oz (75.8 kg)   SpO2 98%   BMI 24.69 kg/m  Wt Readings  from Last 3 Encounters:  11/01/20 167 lb 3.2 oz (75.8 kg)  10/29/19 167 lb 9.6 oz (76 kg)  04/28/19 168 lb 6.4 oz (76.4 kg)     There are no preventive care reminders to display for this patient.   There are no preventive care reminders to display for this patient.  Lab Results  Component Value Date   TSH 1.23 10/29/2019   Lab Results  Component Value Date   WBC 4.0 10/29/2019   HGB 14.3 10/29/2019   HCT 42.2 10/29/2019   MCV 90.7 10/29/2019   PLT 208.0 10/29/2019   Lab Results  Component Value Date   NA 141 10/29/2019   K 3.7 10/29/2019   CO2 31 10/29/2019   GLUCOSE 93 10/29/2019   BUN 12 10/29/2019   CREATININE 0.84 10/29/2019   BILITOT 0.7 10/29/2019   ALKPHOS 84 10/29/2019   AST 18 10/29/2019   ALT 12 10/29/2019   PROT 6.4 10/29/2019   ALBUMIN 4.7 10/29/2019   CALCIUM 9.5 10/29/2019   GFR 92.76 10/29/2019   Lab Results  Component Value Date   CHOL 155 10/29/2019   Lab Results  Component Value Date   HDL 65.10 10/29/2019   Lab Results  Component Value Date   LDLCALC 79 10/29/2019   Lab Results  Component Value Date   TRIG 52.0 10/29/2019   Lab Results  Component Value Date   CHOLHDL 2 10/29/2019   Lab Results  Component Value Date   HGBA1C 4.9 02/24/2016      Assessment & Plan:   Problem List Items Addressed This Visit       Cardiovascular and Mediastinum   Essential hypertension - Primary   Relevant Orders   CBC   Comprehensive metabolic panel   Urinalysis, Routine w reflex microscopic     Musculoskeletal and Integument   Nummular eczema     Other   Healthcare maintenance   Relevant Orders   Lipid panel   PSA    No orders of the defined types were placed in this encounter.   Follow-up: Return in about 1 year (around 11/01/2021), or if symptoms worsen or  fail to improve.  Information given on health maintenance and disease prevention.  Encouraged him to continue his healthy lifestyle with jogging and eating healthily.  Continue amlodipine and losartan for his well-controlled blood pressure.  Advised that he fluid restrict 2 hours before bedtime.  Will let me know if the abdominal pain worsens.  Follow-up with dermatology for upcoming appointment as planned.    Libby Maw, MD

## 2021-02-01 ENCOUNTER — Ambulatory Visit (INDEPENDENT_AMBULATORY_CARE_PROVIDER_SITE_OTHER): Payer: 59

## 2021-02-01 ENCOUNTER — Other Ambulatory Visit: Payer: Self-pay

## 2021-02-01 DIAGNOSIS — Z23 Encounter for immunization: Secondary | ICD-10-CM | POA: Diagnosis not present

## 2021-02-01 NOTE — Patient Instructions (Signed)
After obtaining informed consent, the Flu vaccine is given by [place], RMA.  Pt informed to sit for 20 min to confirm no adverse reactions, pt tolerated injection well and informed to repeat in 1 year.

## 2021-02-06 IMAGING — DX LEFT KNEE - COMPLETE 4+ VIEW
4 series · 4 of 4 positions shown · non-contrast
Comparison: None available.

CLINICAL DATA: Initial evaluation for left knee pain at patellar
tendon region, no injury.

EXAM:
LEFT KNEE - COMPLETE 4+ VIEW

[knee ap]
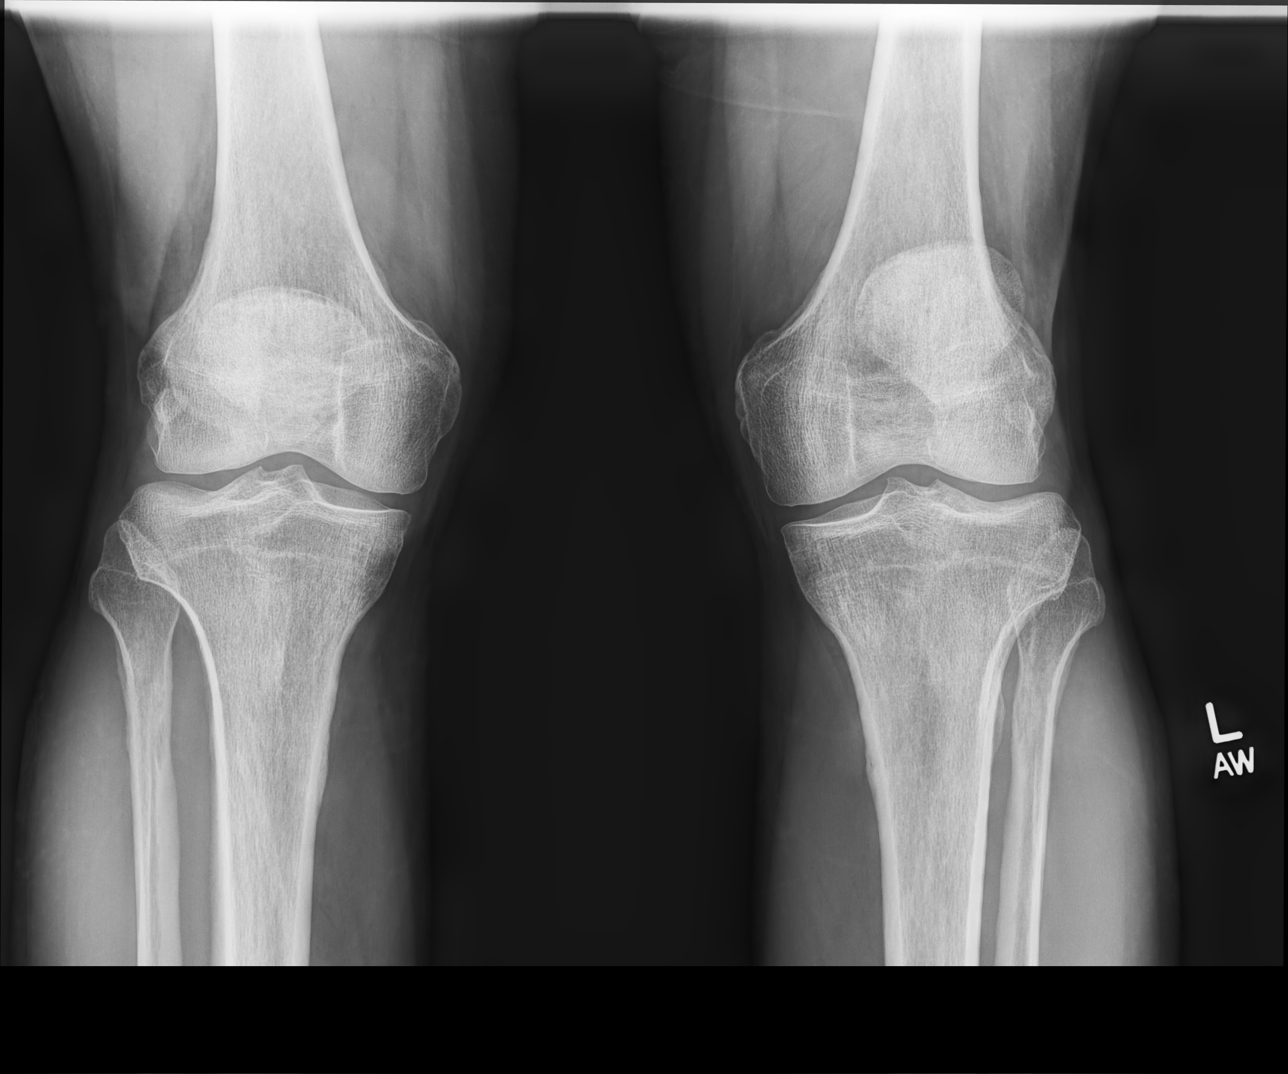

[knee [person_name] view pa]
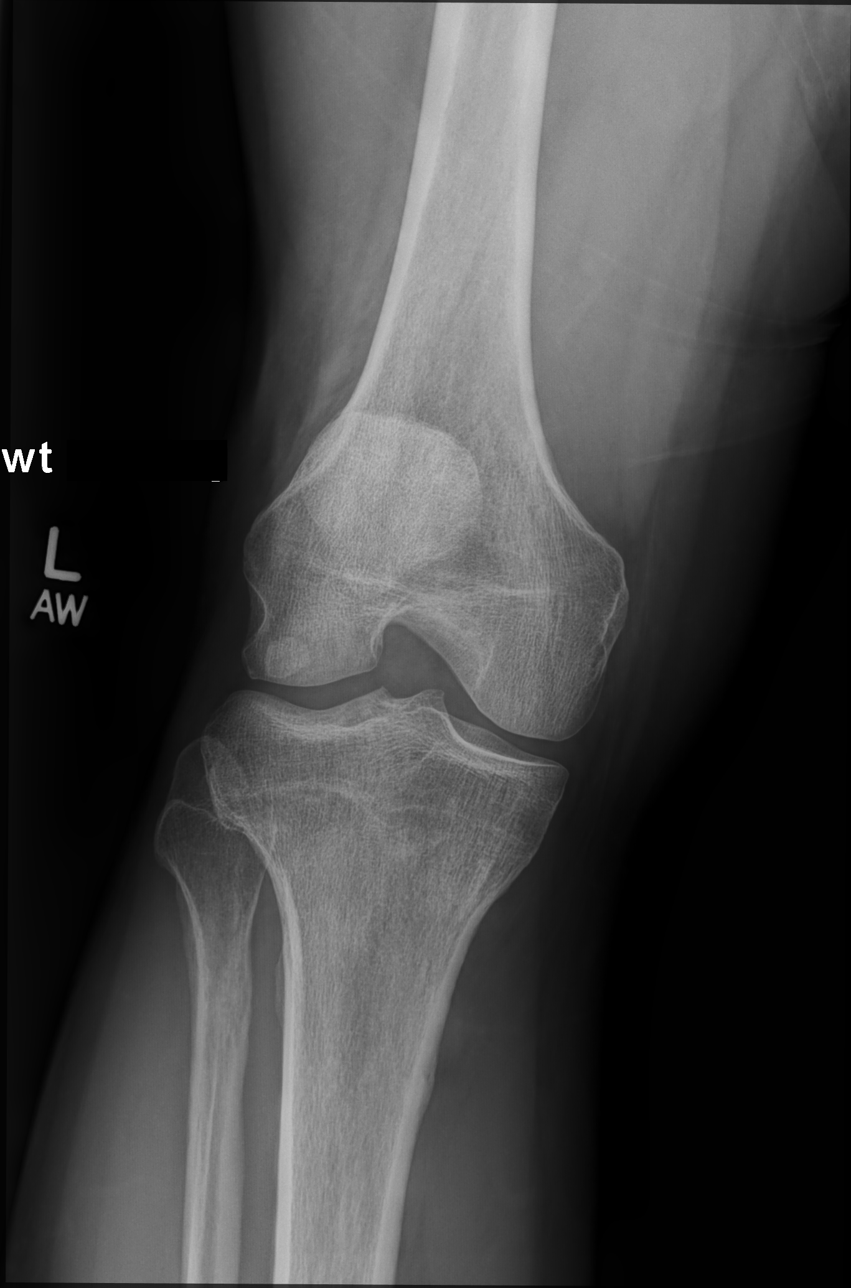

[knee lat]
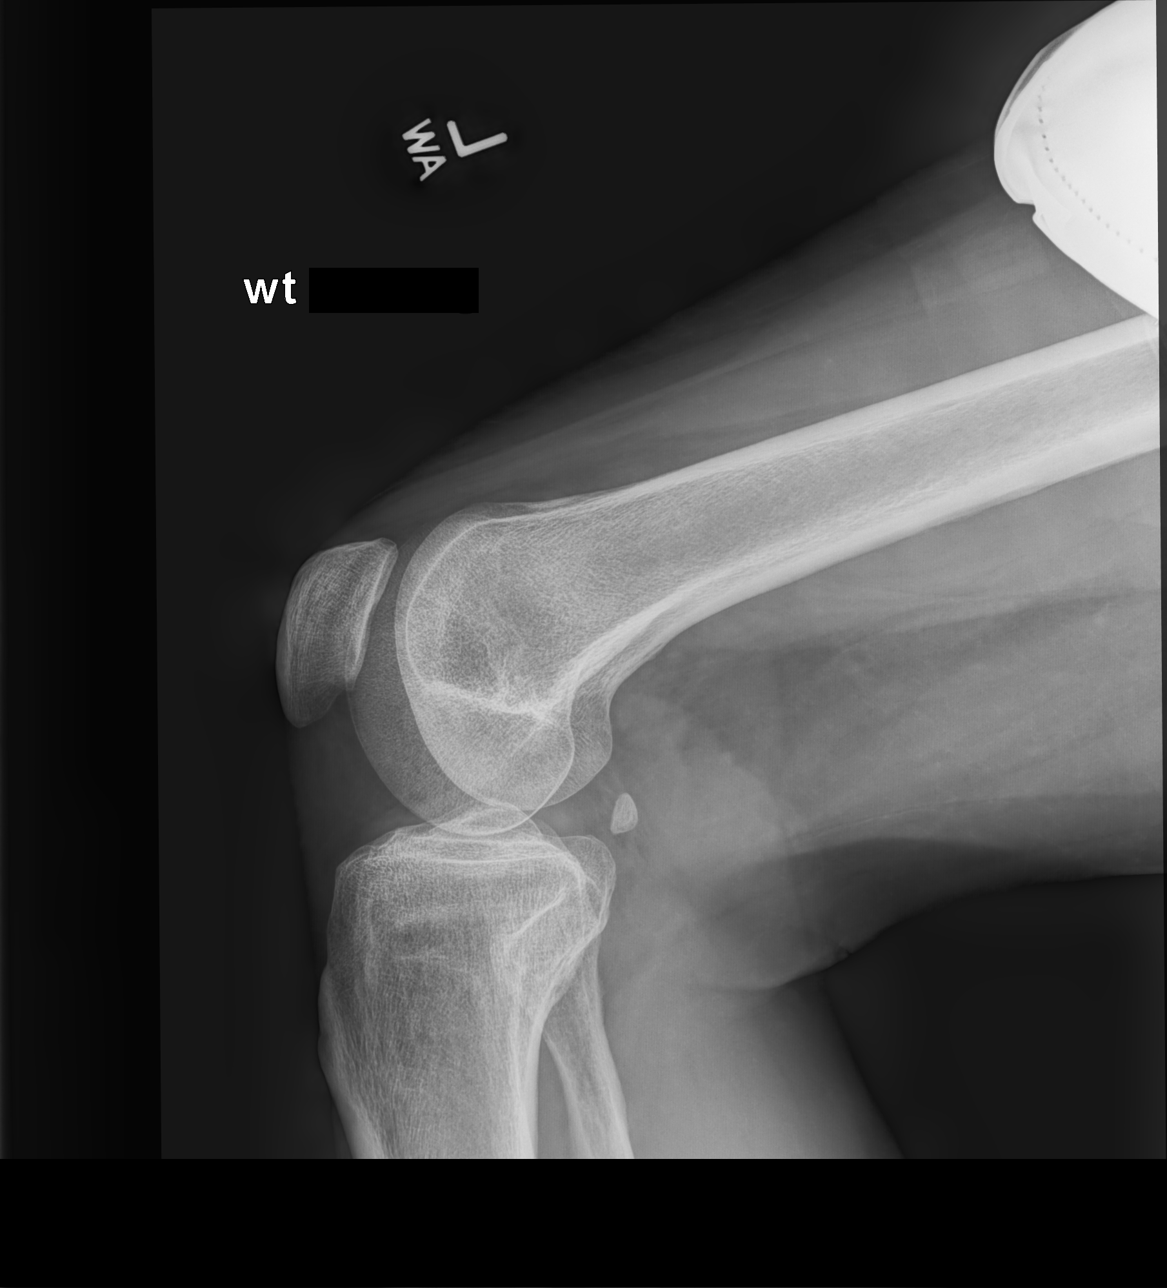

[patella (sunrise)]
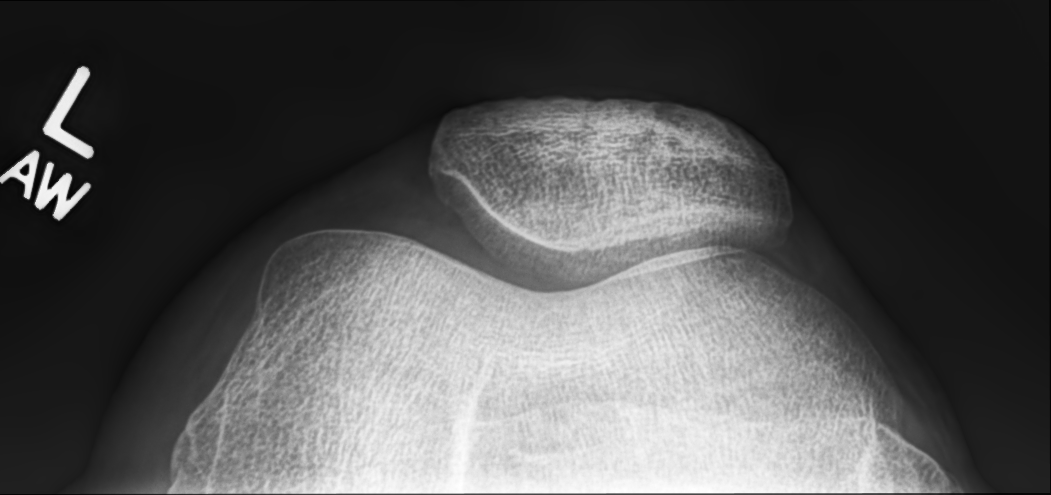

[4 of 4 positions shown; findings below may reference images not displayed]

FINDINGS: No acute fracture or dislocation. No joint effusion. Minimal
degenerative joint space narrowing about the medial femoral tibial
and patellofemoral articulations. Osseous mineralization normal. No
discrete osseous lesions. Visualized soft tissues within normal
limits.
IMPRESSION: 1. No acute osseous abnormality about the left knee.
2. Minimal degenerative osteoarthrosis for age.

## 2021-05-12 ENCOUNTER — Telehealth: Payer: Self-pay | Admitting: Family Medicine

## 2021-05-12 ENCOUNTER — Other Ambulatory Visit: Payer: Self-pay

## 2021-05-12 DIAGNOSIS — L300 Nummular dermatitis: Secondary | ICD-10-CM

## 2021-05-12 NOTE — Telephone Encounter (Signed)
Okay to place referral with new insurance? Please advise last OV 11/01/20

## 2021-05-12 NOTE — Telephone Encounter (Signed)
Referral placed patient aware someone will give him a call to schedule.

## 2021-05-12 NOTE — Telephone Encounter (Signed)
Pt is wanting a dermatology referral and his insurance has changed. I have put in his new insurance info. He is wanting to see Dr. Mickel Fuchs at [facility], phone [phone]. He wants to get checked for skin cancer on his ears. I offered an appointment, he wanted a message put through first. Pt's [phone].

## 2021-05-15 ENCOUNTER — Telehealth: Payer: Self-pay | Admitting: Family Medicine

## 2021-05-15 NOTE — Telephone Encounter (Signed)
Per patient insurance not in network with dermatologist that he was referred to. Could notes be sent to requested dermatologist? Please advise.

## 2021-08-16 ENCOUNTER — Ambulatory Visit: Payer: 59 | Admitting: Dermatology

## 2021-11-02 ENCOUNTER — Encounter: Payer: Self-pay | Admitting: Family Medicine

## 2021-11-09 ENCOUNTER — Ambulatory Visit (INDEPENDENT_AMBULATORY_CARE_PROVIDER_SITE_OTHER): Payer: 59 | Admitting: Family Medicine

## 2021-11-09 ENCOUNTER — Encounter: Payer: Self-pay | Admitting: Family Medicine

## 2021-11-09 VITALS — BP 126/68 | HR 53 | Temp 97.4°F | Ht 70.0 in | Wt 165.4 lb

## 2021-11-09 DIAGNOSIS — Z0000 Encounter for general adult medical examination without abnormal findings: Secondary | ICD-10-CM

## 2021-11-09 DIAGNOSIS — I10 Essential (primary) hypertension: Secondary | ICD-10-CM | POA: Diagnosis not present

## 2021-11-09 LAB — URINALYSIS, ROUTINE W REFLEX MICROSCOPIC
Bilirubin Urine: NEGATIVE
Ketones, ur: NEGATIVE
Leukocytes,Ua: NEGATIVE
Nitrite: NEGATIVE
Specific Gravity, Urine: 1.015 (ref 1.000–1.030)
Total Protein, Urine: NEGATIVE
Urine Glucose: NEGATIVE
Urobilinogen, UA: 0.2 (ref 0.0–1.0)
pH: 6.5 (ref 5.0–8.0)

## 2021-11-09 LAB — COMPREHENSIVE METABOLIC PANEL
ALT: 10 U/L (ref 0–53)
AST: 15 U/L (ref 0–37)
Albumin: 4.6 g/dL (ref 3.5–5.2)
Alkaline Phosphatase: 81 U/L (ref 39–117)
BUN: 21 mg/dL (ref 6–23)
CO2: 30 mEq/L (ref 19–32)
Calcium: 9.3 mg/dL (ref 8.4–10.5)
Chloride: 106 mEq/L (ref 96–112)
Creatinine, Ser: 0.89 mg/dL (ref 0.40–1.50)
GFR: 91.23 mL/min (ref >60.00)
Glucose, Bld: 94 mg/dL (ref 70–99)
Potassium: 3.6 mEq/L (ref 3.5–5.1)
Sodium: 141 mEq/L (ref 135–145)
Total Bilirubin: 0.6 mg/dL (ref 0.2–1.2)
Total Protein: 6.4 g/dL (ref 6.0–8.3)

## 2021-11-09 LAB — CBC
HCT: 41.2 % (ref 39.0–52.0)
Hemoglobin: 13.5 g/dL (ref 13.0–17.0)
MCHC: 32.8 g/dL (ref 30.0–36.0)
MCV: 89 fl (ref 78.0–100.0)
Platelets: 228 10*3/uL (ref 150.0–400.0)
RBC: 4.63 Mil/uL (ref 4.22–5.81)
RDW: 13.9 % (ref 11.5–15.5)
WBC: 4.4 10*3/uL (ref 4.0–10.5)

## 2021-11-09 LAB — LIPID PANEL
Cholesterol: 146 mg/dL (ref 0–200)
HDL: 65.2 mg/dL (ref >39.00)
LDL Cholesterol: 72 mg/dL (ref 0–99)
NonHDL: 80.45
Total CHOL/HDL Ratio: 2
Triglycerides: 42 mg/dL (ref 0.0–149.0)
VLDL: 8.4 mg/dL (ref 0.0–40.0)

## 2021-11-09 LAB — PSA: PSA: 0.44 ng/mL (ref 0.10–4.00)

## 2021-11-09 MED ORDER — AMLODIPINE BESYLATE 5 MG PO TABS: 5.0000 mg | ORAL_TABLET | Freq: Every day | ORAL | 3 refills | Status: DC

## 2021-11-09 MED ORDER — LOSARTAN POTASSIUM 100 MG PO TABS: 100.0000 mg | ORAL_TABLET | Freq: Every day | ORAL | 3 refills | Status: DC

## 2021-11-09 NOTE — Progress Notes (Signed)
Established Patient Office Visit  Subjective   Patient ID: Donald Hampton, male    DOB: 02-Aug-1960  Age: 61 y.o. MRN: [id number]  Chief Complaint  Patient presents with   Annual Exam    CPE, no concerns. Patient fasting.     HPI doing well.  Continues with his healthy lifestyle regular physical activity and healthy eating.  Blood pressures well controlled on current regimen.  Up-to-date on health maintenance.  Has regular dental and eye care.  Continues with occasional nocturia.  Donald wise urine flow is okay.    Review of Systems  Constitutional: Negative.   HENT: Negative.    Eyes:  Negative for blurred vision, discharge and redness.  Respiratory: Negative.    Cardiovascular: Negative.   Gastrointestinal:  Negative for abdominal pain.  Genitourinary: Negative.   Musculoskeletal: Negative.  Negative for myalgias.  Skin:  Negative for rash.  Neurological:  Negative for tingling, loss of consciousness and weakness.  Endo/Heme/Allergies:  Negative for polydipsia.      Objective:     BP 126/68 (BP Location: Left Arm, Patient Position: Sitting, Cuff Size: Normal)   Pulse (!) 53   Temp (!) 97.4 F (36.3 C) (Temporal)   Ht '5\' 10"'$  (1.778 m)   Wt 165 lb 6.4 oz (75 kg)   SpO2 100%   BMI 23.73 kg/m    Physical Exam Constitutional:      General: He is not in acute distress.    Appearance: Normal appearance. He is not ill-appearing, toxic-appearing or diaphoretic.  HENT:     Head: Normocephalic and atraumatic.     Right Ear: External ear normal.     Left Ear: External ear normal.     Mouth/Throat:     Mouth: Mucous membranes are moist.     Pharynx: Oropharynx is clear. No oropharyngeal exudate or posterior oropharyngeal erythema.  Eyes:     General: No scleral icterus.       Right eye: No discharge.        Left eye: No discharge.     Extraocular Movements: Extraocular movements intact.     Conjunctiva/sclera: Conjunctivae normal.     Pupils: Pupils are equal,  round, and reactive to light.  Cardiovascular:     Rate and Rhythm: Normal rate and regular rhythm.  Pulmonary:     Effort: Pulmonary effort is normal. No respiratory distress.     Breath sounds: Normal breath sounds.  Abdominal:     General: Bowel sounds are normal. There is no distension.     Tenderness: There is no abdominal tenderness. There is no guarding.  Musculoskeletal:     Cervical back: No rigidity or tenderness.  Skin:    General: Skin is warm and dry.  Neurological:     Mental Status: He is alert and oriented to person, place, and time.  Psychiatric:        Mood and Affect: Mood normal.        Behavior: Behavior normal.      No results found for any visits on 11/09/21.    The 10-year ASCVD risk score (Arnett DK, et al., 2019) is: 8.8%    Assessment & Plan:   Problem List Items Addressed This Visit       Cardiovascular and Mediastinum   Essential hypertension   Relevant Medications   amLODipine (NORVASC) 5 MG tablet   losartan (COZAAR) 100 MG tablet   Donald Relevant Orders   CBC   Comprehensive metabolic panel  Urinalysis, Routine w reflex microscopic     Donald   Healthcare maintenance - Primary   Relevant Orders   Lipid panel   PSA    Return in about 1 year (around 11/10/2022), or if symptoms worsen or fail to improve.  Continue healthy lifestyle which eat well.  Anticipating normal labs.  Libby Maw, MD

## 2022-01-22 ENCOUNTER — Ambulatory Visit (INDEPENDENT_AMBULATORY_CARE_PROVIDER_SITE_OTHER): Payer: 59

## 2022-01-22 DIAGNOSIS — Z23 Encounter for immunization: Secondary | ICD-10-CM | POA: Diagnosis not present

## 2022-01-22 NOTE — Progress Notes (Signed)
After obtaining consent, and per orders of Dr. Ethelene Hal, injection of Influenza given by Lynda Rainwater. Patient instructed to remain in clinic for 20 minutes afterwards, and to report any adverse reaction to me immediately.

## 2022-02-02 ENCOUNTER — Encounter: Payer: Self-pay | Admitting: Family Medicine

## 2022-07-31 DIAGNOSIS — L814 Other melanin hyperpigmentation: Secondary | ICD-10-CM | POA: Diagnosis not present

## 2022-07-31 DIAGNOSIS — D225 Melanocytic nevi of trunk: Secondary | ICD-10-CM | POA: Diagnosis not present

## 2022-07-31 DIAGNOSIS — L821 Other seborrheic keratosis: Secondary | ICD-10-CM | POA: Diagnosis not present

## 2022-07-31 DIAGNOSIS — D2272 Melanocytic nevi of left lower limb, including hip: Secondary | ICD-10-CM | POA: Diagnosis not present

## 2022-07-31 DIAGNOSIS — Z85828 Personal history of other malignant neoplasm of skin: Secondary | ICD-10-CM | POA: Diagnosis not present

## 2022-11-12 ENCOUNTER — Encounter: Payer: Self-pay | Admitting: Family Medicine

## 2022-11-12 ENCOUNTER — Ambulatory Visit: Payer: 59 | Admitting: Family Medicine

## 2022-11-12 VITALS — BP 134/82 | HR 53 | Temp 97.0°F | Ht 70.0 in | Wt 165.0 lb

## 2022-11-12 DIAGNOSIS — Z0000 Encounter for general adult medical examination without abnormal findings: Secondary | ICD-10-CM

## 2022-11-12 DIAGNOSIS — I10 Essential (primary) hypertension: Secondary | ICD-10-CM

## 2022-11-12 DIAGNOSIS — D649 Anemia, unspecified: Secondary | ICD-10-CM

## 2022-11-12 LAB — CBC
HCT: 38.4 % — ABNORMAL LOW (ref 39.0–52.0)
Hemoglobin: 12.6 g/dL — ABNORMAL LOW (ref 13.0–17.0)
MCHC: 32.8 g/dL (ref 30.0–36.0)
MCV: 85.1 fl (ref 78.0–100.0)
Platelets: 253 10*3/uL (ref 150.0–400.0)
RBC: 4.51 Mil/uL (ref 4.22–5.81)
RDW: 13.8 % (ref 11.5–15.5)
WBC: 5.1 10*3/uL (ref 4.0–10.5)

## 2022-11-12 LAB — COMPREHENSIVE METABOLIC PANEL
ALT: 10 U/L (ref 0–53)
AST: 16 U/L (ref 0–37)
Albumin: 4.5 g/dL (ref 3.5–5.2)
Alkaline Phosphatase: 72 U/L (ref 39–117)
BUN: 19 mg/dL (ref 6–23)
CO2: 27 mEq/L (ref 19–32)
Calcium: 9.5 mg/dL (ref 8.4–10.5)
Chloride: 106 mEq/L (ref 96–112)
Creatinine, Ser: 0.91 mg/dL (ref 0.40–1.50)
GFR: 89.09 mL/min (ref >60.00)
Glucose, Bld: 93 mg/dL (ref 70–99)
Potassium: 3.9 mEq/L (ref 3.5–5.1)
Sodium: 141 mEq/L (ref 135–145)
Total Bilirubin: 0.6 mg/dL (ref 0.2–1.2)
Total Protein: 6.5 g/dL (ref 6.0–8.3)

## 2022-11-12 LAB — LIPID PANEL
Cholesterol: 149 mg/dL (ref 0–200)
HDL: 65.9 mg/dL (ref >39.00)
LDL Cholesterol: 74 mg/dL (ref 0–99)
NonHDL: 83.35
Total CHOL/HDL Ratio: 2
Triglycerides: 48 mg/dL (ref 0.0–149.0)
VLDL: 9.6 mg/dL (ref 0.0–40.0)

## 2022-11-12 LAB — PSA: PSA: 0.91 ng/mL (ref 0.10–4.00)

## 2022-11-12 MED ORDER — AMLODIPINE BESYLATE 5 MG PO TABS
5.0000 mg | ORAL_TABLET | Freq: Every day | ORAL | 3 refills | Status: DC
Start: 2022-11-12 — End: 2023-07-29

## 2022-11-12 MED ORDER — LOSARTAN POTASSIUM 100 MG PO TABS: 100.0000 mg | ORAL_TABLET | Freq: Every day | ORAL | 3 refills | Status: DC

## 2022-11-12 NOTE — Progress Notes (Addendum)
Established Patient Office Visit   Subjective:  Patient ID: Donald Hampton, male    DOB: [birth year]  Age: 62 y.o. MRN: [id number]  Chief Complaint  Patient presents with   Annual Exam    Pt is here CPE. Pt is fasting.     HPI Encounter Diagnoses  Name Primary?   Essential hypertension Yes   Healthcare maintenance    Normocytic anemia    Doing okay.  Continues with regular physical activity.  Enjoying retirement.  Younger brother recently passed from acute liver failure.   Review of Systems  Constitutional: Negative.   HENT: Negative.    Eyes:  Negative for blurred vision, discharge and redness.  Respiratory: Negative.    Cardiovascular: Negative.   Gastrointestinal:  Negative for abdominal pain.  Genitourinary: Negative.   Musculoskeletal: Negative.  Negative for myalgias.  Skin:  Negative for rash.  Neurological:  Negative for tingling, loss of consciousness and weakness.  Endo/Heme/Allergies:  Negative for polydipsia.     Current Outpatient Medications:    amLODipine (NORVASC) 5 MG tablet, Take 1 tablet (5 mg total) by mouth daily., Disp: 90 tablet, Rfl: 3   losartan (COZAAR) 100 MG tablet, Take 1 tablet (100 mg total) by mouth daily., Disp: 90 tablet, Rfl: 3   Objective:     BP 134/82   Pulse (!) 53   Temp (!) 97 F (36.1 C)   Ht 5\' 10"  (1.778 m)   Wt 165 lb (74.8 kg)   SpO2 99%   BMI 23.68 kg/m    Physical Exam Constitutional:      General: He is not in acute distress.    Appearance: Normal appearance. He is not ill-appearing, toxic-appearing or diaphoretic.  HENT:     Head: Normocephalic and atraumatic.     Right Ear: External ear normal.     Left Ear: External ear normal.     Mouth/Throat:     Mouth: Mucous membranes are moist.     Pharynx: Oropharynx is clear. No oropharyngeal exudate or posterior oropharyngeal erythema.  Eyes:     General: No scleral icterus.       Right eye: No discharge.        Left eye: No discharge.     Extraocular  Movements: Extraocular movements intact.     Conjunctiva/sclera: Conjunctivae normal.     Pupils: Pupils are equal, round, and reactive to light.  Cardiovascular:     Rate and Rhythm: Normal rate and regular rhythm.  Pulmonary:     Effort: Pulmonary effort is normal. No respiratory distress.     Breath sounds: Normal breath sounds.  Abdominal:     General: Bowel sounds are normal.     Tenderness: There is no abdominal tenderness. There is no guarding or rebound.  Musculoskeletal:     Cervical back: No rigidity or tenderness.  Lymphadenopathy:     Cervical: No cervical adenopathy.  Skin:    General: Skin is warm and dry.  Neurological:     Mental Status: He is alert and oriented to person, place, and time.  Psychiatric:        Mood and Affect: Mood normal.        Behavior: Behavior normal.      Results for orders placed or performed in visit on 11/12/22  Comprehensive metabolic panel  Result Value Ref Range   Sodium 141 135 - 145 mEq/L   Potassium 3.9 3.5 - 5.1 mEq/L   Chloride 106 96 - 112 mEq/L  CO2 27 19 - 32 mEq/L   Glucose, Bld 93 70 - 99 mg/dL   BUN 19 6 - 23 mg/dL   Creatinine, Ser 9.51 0.40 - 1.50 mg/dL   Total Bilirubin 0.6 0.2 - 1.2 mg/dL   Alkaline Phosphatase 72 39 - 117 U/L   AST 16 0 - 37 U/L   ALT 10 0 - 53 U/L   Total Protein 6.5 6.0 - 8.3 g/dL   Albumin 4.5 3.5 - 5.2 g/dL   GFR 88.41 >66.06 mL/min   Calcium 9.5 8.4 - 10.5 mg/dL  Lipid panel  Result Value Ref Range   Cholesterol 149 0 - 200 mg/dL   Triglycerides 30.1 0.0 - 149.0 mg/dL   HDL 60.10 >93.23 mg/dL   VLDL 9.6 0.0 - 55.7 mg/dL   LDL Cholesterol 74 0 - 99 mg/dL   Total CHOL/HDL Ratio 2    NonHDL 83.35   PSA  Result Value Ref Range   PSA 0.91 0.10 - 4.00 ng/mL  CBC  Result Value Ref Range   WBC 5.1 4.0 - 10.5 K/uL   RBC 4.51 4.22 - 5.81 Mil/uL   Platelets 253.0 150.0 - 400.0 K/uL   Hemoglobin 12.6 (L) 13.0 - 17.0 g/dL   HCT 32.2 (L) 02.5 - 42.7 %   MCV 85.1 78.0 - 100.0 fl   MCHC  32.8 30.0 - 36.0 g/dL   RDW 06.2 37.6 - 28.3 %      The 10-year ASCVD risk score (Arnett DK, et al., 2019) is: 10.2%    Assessment & Plan:   Essential hypertension -     Comprehensive metabolic panel -     amLODIPine Besylate; Take 1 tablet (5 mg total) by mouth daily.  Dispense: 90 tablet; Refill: 3 -     Losartan Potassium; Take 1 tablet (100 mg total) by mouth daily.  Dispense: 90 tablet; Refill: 3 -     CBC  Healthcare maintenance -     Lipid panel -     PSA  Normocytic anemia -     Iron, TIBC and Ferritin Panel; Future -     B12 and Folate Panel -     Urinalysis, Routine w reflex microscopic    Return in about 1 year (around 11/12/2023), or if symptoms worsen or fail to improve.    Mliss Sax, MD

## 2022-11-13 NOTE — Addendum Note (Signed)
Addended by: Andrez Grime on: 11/13/2022 08:17 AM   Modules accepted: Orders

## 2022-11-13 NOTE — Addendum Note (Signed)
Addended by: Andrez Grime on: 11/13/2022 08:15 AM   Modules accepted: Orders

## 2023-04-17 ENCOUNTER — Other Ambulatory Visit: Payer: Self-pay | Admitting: Medical Genetics

## 2023-05-16 ENCOUNTER — Other Ambulatory Visit (HOSPITAL_COMMUNITY)
Admission: RE | Admit: 2023-05-16 | Discharge: 2023-05-16 | Disposition: A | Discharge status: Home / Self Care | Payer: Self-pay | Source: Ambulatory Visit | Attending: Medical Genetics | Admitting: Medical Genetics

## 2023-05-17 ENCOUNTER — Telehealth: Payer: Self-pay | Admitting: Family Medicine

## 2023-05-17 NOTE — Telephone Encounter (Signed)
 ERROR

## 2023-05-27 LAB — GENECONNECT MOLECULAR SCREEN: Genetic Analysis Overall Interpretation: NEGATIVE

## 2023-07-28 ENCOUNTER — Other Ambulatory Visit: Payer: Self-pay | Admitting: Family Medicine

## 2023-07-28 DIAGNOSIS — I10 Essential (primary) hypertension: Secondary | ICD-10-CM

## 2023-07-31 ENCOUNTER — Ambulatory Visit: Admitting: Family Medicine

## 2023-08-15 ENCOUNTER — Ambulatory Visit (INDEPENDENT_AMBULATORY_CARE_PROVIDER_SITE_OTHER): Admitting: Internal Medicine

## 2023-08-15 ENCOUNTER — Encounter: Payer: Self-pay | Admitting: Internal Medicine

## 2023-08-15 VITALS — BP 130/82 | HR 69 | Temp 98.5°F | Ht 70.0 in | Wt 166.8 lb

## 2023-08-15 DIAGNOSIS — M545 Low back pain, unspecified: Secondary | ICD-10-CM

## 2023-08-15 MED ORDER — LIDOCAINE 5 % EX PTCH: 1.0000 | MEDICATED_PATCH | Freq: Two times a day (BID) | CUTANEOUS | 0 refills | Status: DC | PRN

## 2023-08-15 MED ORDER — METHOCARBAMOL 500 MG PO TABS: 500.0000 mg | ORAL_TABLET | Freq: Three times a day (TID) | ORAL | 0 refills | Status: DC | PRN

## 2023-08-15 NOTE — Patient Instructions (Addendum)
 Ibuprofen - take 2-4 tablets every 8 hours as needed for pain  Heating pad  Rest , no heavy lifting

## 2023-08-15 NOTE — Progress Notes (Signed)
 [facility] [address] [place] [address] Dept: [phone] Dept Fax: [phone]  Acute Care Office Visit  Subjective:   Donald Hampton [facility] 08/01/1960 08/15/2023  Chief Complaint  Patient presents with   Back Pain    Started 2 weeks ago was getting better and after yesterday pain started again  Pain  in  thigh  Taking advil     HPI: Donald Hampton s a 63 yo M who complains of back pain onset 2 weeks ago while working outside in his yard.  Pain initially started to get better with doing heating pad, rest, and ibuprofen.  However, yesterday, he bent over to tie his shoes and felt a sharp pain in his right lower back.  Since then, pain has been present, exacerbated with movements such as bending, and changing from sitting to standing position.  Pain does not radiate.  He denies fever, numbness or tingling in his lower extremities, bowel or bladder incontinence.  Currently pain is a 9 out of 10 at its worst.  He has taken Advil. Was working out in yard day before onset.    The following portions of the patient's history were reviewed and updated as appropriate: past medical history, past surgical history, family history, social history, allergies, medications, and problem list.   Patient Active Problem List   Diagnosis Date Noted   Nummular eczema 11/01/2020   Need for shingles vaccine 11/21/2018   Unilateral inguinal hernia without obstruction or gangrene 03/13/2018   Other male erectile dysfunction 03/13/2018   Acute sinusitis 01/06/2018   Eustachian tube disorder, left 01/06/2018   Essential hypertension 07/05/2017   Episodic cluster headache, not intractable 07/05/2017   Healthcare maintenance 07/05/2017   Past Medical History:  Diagnosis Date   Asthma    beginning left knee   Chicken pox    Chronic kidney disease    Frequent headaches    Hypertension    Kidney stones    Migraines    Past Surgical  History:  Procedure Laterality Date   COLONOSCOPY     HERNIA REPAIR  2020   LITHOTRIPSY     REFRACTIVE SURGERY     TONSILLECTOMY     Family History  Problem Relation Age of Onset   Heart disease Mother    Kidney disease Father    Arthritis Father    Heart disease Maternal Grandfather    Early death Paternal Grandmother    Early death Paternal Grandfather    Colon cancer Neg Hx    Colon polyps Neg Hx    Esophageal cancer Neg Hx    Rectal cancer Neg Hx    Stomach cancer Neg Hx     Current Outpatient Medications:    amLODipine (NORVASC) 5 MG tablet, TAKE ONE TABLET BY MOUTH ONE TIME DAILY, Disp: 90 tablet, Rfl: 1   lidocaine (LIDODERM) 5 %, Place 1 patch onto the skin every 12 (twelve) hours as needed (pain)., Disp: 30 patch, Rfl: 0   losartan (COZAAR) 100 MG tablet, TAKE ONE TABLET BY MOUTH ONE TIME DAILY, Disp: 90 tablet, Rfl: 1   methocarbamol (ROBAXIN) 500 MG tablet, Take 1 tablet (500 mg total) by mouth every 8 (eight) hours as needed for muscle spasms., Disp: 30 tablet, Rfl: 0 No Known Allergies   ROS: A complete ROS was performed with pertinent positives/negatives noted in the HPI. The remainder of the ROS are negative.    Objective:   Today's Vitals   08/15/23 1318  BP: 130/82  Pulse: 69  Temp: 98.5 F (36.9 C)  TempSrc: Temporal  SpO2: 99%  Weight: 166 lb 12.8 oz (75.7 kg)  Height: 5\' 10"  (1.778 m)    GENERAL: Well-appearing, in NAD. Well nourished.  SKIN: Pink, warm and dry.  NECK: Trachea midline. w/o pain or tenderness.  RESPIRATORY: Chest wall symmetrical. Respirations even and non-labored. Breath sounds clear to auscultation bilaterally.  CARDIAC: S1, S2 present, regular rate and rhythm. Peripheral pulses 2+ bilaterally.  MSK: Muscle tone and strength appropriate for age.  Point tenderness to right lumbar region.  No spinal tenderness. EXTREMITIES: Without clubbing, cyanosis, or edema.  NEUROLOGIC: No sensory deficits. Steady, even gait.  PSYCH/MENTAL  STATUS: Alert, oriented x 3. Cooperative, appropriate mood and affect.    No results found for any visits on 08/15/23.    Assessment & Plan:  1. Acute right-sided low back pain without sciatica (Primary) - lidocaine (LIDODERM) 5 %; Place 1 patch onto the skin every 12 (twelve) hours as needed (pain).  Dispense: 30 patch; Refill: 0 - methocarbamol (ROBAXIN) 500 MG tablet; Take 1 tablet (500 mg total) by mouth every 8 (eight) hours as needed for muscle spasms.  Dispense: 30 tablet; Refill: 0 - Recommend patient take ibuprofen 2 to 4 tablets every 8 hours as needed for pain -Continue rest, no heavy lifting.  Can use heating pad as needed. - Stretches and strength training exercises for lower back provided on discharge summary  Meds ordered this encounter  Medications   lidocaine (LIDODERM) 5 %    Sig: Place 1 patch onto the skin every 12 (twelve) hours as needed (pain).    Dispense:  30 patch    Refill:  0    Supervising Provider:   THOMPSON, AARON B [[id number]]   methocarbamol (ROBAXIN) 500 MG tablet    Sig: Take 1 tablet (500 mg total) by mouth every 8 (eight) hours as needed for muscle spasms.    Dispense:  30 tablet    Refill:  0    Supervising Provider:   THOMPSON, AARON B [[id number]]   No orders of the defined types were placed in this encounter.  Lab Orders  No laboratory test(s) ordered today   No images are attached to the encounter or orders placed in the encounter.  Return if symptoms worsen or fail to improve.   Donald Kast, FNP

## 2023-08-16 ENCOUNTER — Other Ambulatory Visit (HOSPITAL_COMMUNITY): Payer: Self-pay

## 2023-08-16 ENCOUNTER — Telehealth: Payer: Self-pay

## 2023-08-16 NOTE — Telephone Encounter (Signed)
 Pharmacy Patient Advocate Encounter   Received notification from CoverMyMeds that prior authorization for Lidocaine  5% patches is required/requested.   Insurance verification completed.   The patient is insured through [facility] [place] .   Per test claim: PA required; PA submitted to above mentioned insurance via CoverMyMeds Key/confirmation #/EOC (Key: [id number])      Status is pending

## 2023-08-16 NOTE — Telephone Encounter (Signed)
 Pharmacy Patient Advocate Encounter  Received notification from [employer] that Prior Authorization for Lidocaine  5% patches has been APPROVED from 404/25 to until further notice. Unable to obtain price due to refill too soon rejection, last fill date 08/15/23 next available fill date04/28/25   PA #/Case ID/Reference #:  [id number]

## 2023-11-18 ENCOUNTER — Ambulatory Visit (INDEPENDENT_AMBULATORY_CARE_PROVIDER_SITE_OTHER): Payer: 59 | Admitting: Family Medicine

## 2023-11-18 ENCOUNTER — Encounter: Payer: Self-pay | Admitting: Family Medicine

## 2023-11-18 ENCOUNTER — Encounter: Payer: 59 | Admitting: Family Medicine

## 2023-11-18 VITALS — BP 130/78 | HR 54 | Temp 98.3°F | Ht 70.0 in | Wt 165.2 lb

## 2023-11-18 DIAGNOSIS — I10 Essential (primary) hypertension: Secondary | ICD-10-CM

## 2023-11-18 DIAGNOSIS — R351 Nocturia: Secondary | ICD-10-CM

## 2023-11-18 DIAGNOSIS — Z0000 Encounter for general adult medical examination without abnormal findings: Secondary | ICD-10-CM | POA: Diagnosis not present

## 2023-11-18 DIAGNOSIS — D649 Anemia, unspecified: Secondary | ICD-10-CM | POA: Diagnosis not present

## 2023-11-18 DIAGNOSIS — E611 Iron deficiency: Secondary | ICD-10-CM

## 2023-11-18 DIAGNOSIS — Z131 Encounter for screening for diabetes mellitus: Secondary | ICD-10-CM

## 2023-11-18 DIAGNOSIS — E538 Deficiency of other specified B group vitamins: Secondary | ICD-10-CM

## 2023-11-18 DIAGNOSIS — Z13220 Encounter for screening for lipoid disorders: Secondary | ICD-10-CM

## 2023-11-18 DIAGNOSIS — Z23 Encounter for immunization: Secondary | ICD-10-CM

## 2023-11-18 LAB — HEMOGLOBIN A1C: Hgb A1c MFr Bld: 5.7 % (ref 4.6–6.5)

## 2023-11-18 LAB — URINALYSIS, ROUTINE W REFLEX MICROSCOPIC
Bilirubin Urine: NEGATIVE
Hgb urine dipstick: NEGATIVE
Ketones, ur: NEGATIVE
Leukocytes,Ua: NEGATIVE
Nitrite: NEGATIVE
RBC / HPF: NONE SEEN (ref 0–?)
Specific Gravity, Urine: 1.015 (ref 1.000–1.030)
Total Protein, Urine: NEGATIVE
Urine Glucose: NEGATIVE
Urobilinogen, UA: 0.2 (ref 0.0–1.0)
pH: 6.5 (ref 5.0–8.0)

## 2023-11-18 LAB — CBC WITH DIFFERENTIAL/PLATELET
Basophils Absolute: 0 K/uL (ref 0.0–0.1)
Basophils Relative: 0.9 % (ref 0.0–3.0)
Eosinophils Absolute: 0 K/uL (ref 0.0–0.7)
Eosinophils Relative: 1 % (ref 0.0–5.0)
HCT: 36.9 % — ABNORMAL LOW (ref 39.0–52.0)
Hemoglobin: 12.1 g/dL — ABNORMAL LOW (ref 13.0–17.0)
Lymphocytes Relative: 27.5 % (ref 12.0–46.0)
Lymphs Abs: 1.2 K/uL (ref 0.7–4.0)
MCHC: 32.7 g/dL (ref 30.0–36.0)
MCV: 79.5 fl (ref 78.0–100.0)
Monocytes Absolute: 0.3 K/uL (ref 0.1–1.0)
Monocytes Relative: 7.5 % (ref 3.0–12.0)
Neutro Abs: 2.9 K/uL (ref 1.4–7.7)
Neutrophils Relative %: 63.1 % (ref 43.0–77.0)
Platelets: 226 K/uL (ref 150.0–400.0)
RBC: 4.65 Mil/uL (ref 4.22–5.81)
RDW: 16.6 % — ABNORMAL HIGH (ref 11.5–15.5)
WBC: 4.5 K/uL (ref 4.0–10.5)

## 2023-11-18 LAB — COMPREHENSIVE METABOLIC PANEL WITH GFR
ALT: 11 U/L (ref 0–53)
AST: 19 U/L (ref 0–37)
Albumin: 4.6 g/dL (ref 3.5–5.2)
Alkaline Phosphatase: 79 U/L (ref 39–117)
BUN: 18 mg/dL (ref 6–23)
CO2: 29 meq/L (ref 19–32)
Calcium: 9.2 mg/dL (ref 8.4–10.5)
Chloride: 104 meq/L (ref 96–112)
Creatinine, Ser: 0.86 mg/dL (ref 0.40–1.50)
GFR: 90.88 mL/min (ref >60.00)
Glucose, Bld: 93 mg/dL (ref 70–99)
Potassium: 3.7 meq/L (ref 3.5–5.1)
Sodium: 141 meq/L (ref 135–145)
Total Bilirubin: 0.5 mg/dL (ref 0.2–1.2)
Total Protein: 6.4 g/dL (ref 6.0–8.3)

## 2023-11-18 LAB — VITAMIN B12: Vitamin B-12: 135 pg/mL — ABNORMAL LOW (ref 211–911)

## 2023-11-18 LAB — LIPID PANEL
Cholesterol: 152 mg/dL (ref 0–200)
HDL: 68.4 mg/dL (ref >39.00)
LDL Cholesterol: 74 mg/dL (ref 0–99)
NonHDL: 83.85
Total CHOL/HDL Ratio: 2
Triglycerides: 50 mg/dL (ref 0.0–149.0)
VLDL: 10 mg/dL (ref 0.0–40.0)

## 2023-11-18 LAB — IRON,TIBC AND FERRITIN PANEL
%SAT: 10 % — ABNORMAL LOW (ref 20–48)
Ferritin: 5 ng/mL — ABNORMAL LOW (ref 24–380)
Iron: 37 ug/dL — ABNORMAL LOW (ref 50–180)
TIBC: 389 ug/dL (ref 250–425)

## 2023-11-18 LAB — PSA: PSA: 0.69 ng/mL (ref 0.10–4.00)

## 2023-11-18 NOTE — Progress Notes (Signed)
 Established Patient Office Visit   Subjective:  Patient ID: Donald Hampton, male    DOB: 1960/07/27  Age: 63 y.o. MRN: [id number]  Chief Complaint  Patient presents with   Annual Exam    Fasting  Needs Medicare Wellness     HPI Encounter Diagnoses  Name Primary?   Healthcare maintenance Yes   Essential hypertension    Screening for cholesterol level    Screening for diabetes mellitus    Immunization due    Normocytic anemia    Nocturia    Here for physical and follow-up of hypertension that is well controlled with losartan  and amlodipine .  He exercises by walking, running.  Regular dental care.  Urine flow is okay but has nocturia x 1 nightly.   Review of Systems  Constitutional: Negative.   HENT: Negative.    Eyes:  Negative for blurred vision, discharge and redness.  Respiratory: Negative.    Cardiovascular: Negative.   Gastrointestinal:  Negative for abdominal pain.  Genitourinary: Negative.   Musculoskeletal: Negative.  Negative for myalgias.  Skin:  Negative for rash.  Neurological:  Negative for tingling, loss of consciousness and weakness.  Endo/Heme/Allergies:  Negative for polydipsia.     Current Outpatient Medications:    amLODipine  (NORVASC ) 5 MG tablet, TAKE ONE TABLET BY MOUTH ONE TIME DAILY, Disp: 90 tablet, Rfl: 1   losartan  (COZAAR ) 100 MG tablet, TAKE ONE TABLET BY MOUTH ONE TIME DAILY, Disp: 90 tablet, Rfl: 1   methocarbamol  (ROBAXIN ) 500 MG tablet, Take 1 tablet (500 mg total) by mouth every 8 (eight) hours as needed for muscle spasms., Disp: 30 tablet, Rfl: 0   lidocaine  (LIDODERM ) 5 %, Place 1 patch onto the skin every 12 (twelve) hours as needed (pain). (Patient not taking: Reported on 11/18/2023), Disp: 30 patch, Rfl: 0   Objective:     BP 130/78   Pulse (!) 54   Temp 98.3 F (36.8 C) (Temporal)   Ht 5' 10 (1.778 m)   Wt 165 lb 3.2 oz (74.9 kg)   SpO2 99%   BMI 23.70 kg/m  BP Readings from Last 3 Encounters:  11/18/23 130/78   08/15/23 130/82  11/12/22 134/82      Physical Exam Constitutional:      General: He is not in acute distress.    Appearance: Normal appearance. He is not ill-appearing, toxic-appearing or diaphoretic.  HENT:     Head: Normocephalic and atraumatic.     Right Ear: Tympanic membrane, ear canal and external ear normal.     Left Ear: Tympanic membrane, ear canal and external ear normal.     Mouth/Throat:     Mouth: Mucous membranes are moist.     Pharynx: Oropharynx is clear. No oropharyngeal exudate or posterior oropharyngeal erythema.  Eyes:     General: No scleral icterus.       Right eye: No discharge.        Left eye: No discharge.     Extraocular Movements: Extraocular movements intact.     Conjunctiva/sclera: Conjunctivae normal.     Pupils: Pupils are equal, round, and reactive to light.  Cardiovascular:     Rate and Rhythm: Normal rate and regular rhythm.  Pulmonary:     Effort: Pulmonary effort is normal. No respiratory distress.     Breath sounds: Normal breath sounds. No wheezing or rales.  Abdominal:     General: Bowel sounds are normal.     Tenderness: There is no abdominal tenderness. There is  no guarding or rebound.     Hernia: There is no hernia in the left inguinal area or right inguinal area.  Genitourinary:    Penis: Circumcised. No hypospadias, erythema, tenderness, discharge, swelling or lesions.      Testes:        Right: Mass, tenderness or swelling not present. Right testis is descended.        Left: Mass, tenderness or swelling not present. Left testis is descended.     Epididymis:     Right: Not inflamed or enlarged.     Left: Not inflamed or enlarged.  Musculoskeletal:     Cervical back: No rigidity or tenderness.  Lymphadenopathy:     Cervical: No cervical adenopathy.     Lower Body: No right inguinal adenopathy. No left inguinal adenopathy.  Skin:    General: Skin is warm and dry.  Neurological:     Mental Status: He is alert and oriented to  person, place, and time.  Psychiatric:        Mood and Affect: Mood normal.        Behavior: Behavior normal.      No results found for any visits on 11/18/23.    The 10-year ASCVD risk score (Arnett DK, et al., 2019) is: 10.6%    Assessment & Plan:   Healthcare maintenance -     Urinalysis, Routine w reflex microscopic  Essential hypertension -     Comprehensive metabolic panel with GFR  Screening for cholesterol level -     Comprehensive metabolic panel with GFR -     Lipid panel  Screening for diabetes mellitus -     Comprehensive metabolic panel with GFR -     Hemoglobin A1c  Immunization due -     Pneumococcal conjugate vaccine 20-valent  Normocytic anemia -     CBC with Differential/Platelet -     Vitamin B12 -     Iron , TIBC and Ferritin Panel  Nocturia -     PSA    Return in about 1 year (around 11/17/2024), or if symptoms worsen or fail to improve, for chronic disease follow-up, annual physical.  Continue amlodipine  and losartan  blood pressure.  Continue healthy active lifestyle.  Information was given on managing hypertension.  Information given on health maintenance and disease prevention.  This patient fluids a couple of hours before bedtime do not nocturia.   Elsie Sim Lent, MD

## 2023-11-19 ENCOUNTER — Ambulatory Visit: Payer: Self-pay | Admitting: Family Medicine

## 2023-11-19 MED ORDER — IRON (FERROUS SULFATE) 325 (65 FE) MG PO TABS: 325.0000 mg | ORAL_TABLET | Freq: Every day | ORAL | 3 refills | Status: AC

## 2023-11-19 MED ORDER — VITAMIN B-12 1000 MCG PO TABS: 1000.0000 ug | ORAL_TABLET | Freq: Every day | ORAL | 3 refills | Status: AC

## 2023-11-19 NOTE — Addendum Note (Signed)
 Addended by: BERNETA ELSIE LABOR on: 11/19/2023 07:04 AM   Modules accepted: Orders

## 2023-11-25 NOTE — Telephone Encounter (Signed)
 Copied from CRM [id number]. Topic: Clinical - Request for Lab/Test Order >> Nov 25, 2023 10:42 AM Donald Hampton wrote: Reason for CRM: Patient would like to lab work done before physical. Preferably 10/223

## 2024-02-24 ENCOUNTER — Encounter: Payer: Self-pay | Admitting: Family Medicine

## 2024-02-24 ENCOUNTER — Ambulatory Visit (INDEPENDENT_AMBULATORY_CARE_PROVIDER_SITE_OTHER): Admitting: Family Medicine

## 2024-02-24 ENCOUNTER — Other Ambulatory Visit

## 2024-02-24 VITALS — BP 136/78 | HR 48 | Temp 97.6°F | Ht 70.0 in | Wt 167.4 lb

## 2024-02-24 DIAGNOSIS — D649 Anemia, unspecified: Secondary | ICD-10-CM | POA: Diagnosis not present

## 2024-02-24 DIAGNOSIS — E538 Deficiency of other specified B group vitamins: Secondary | ICD-10-CM

## 2024-02-24 DIAGNOSIS — R7309 Other abnormal glucose: Secondary | ICD-10-CM

## 2024-02-24 DIAGNOSIS — E611 Iron deficiency: Secondary | ICD-10-CM

## 2024-02-24 LAB — CBC WITH DIFFERENTIAL/PLATELET
Basophils Absolute: 0 K/uL (ref 0.0–0.1)
Basophils Relative: 0.8 % (ref 0.0–3.0)
Eosinophils Absolute: 0 K/uL (ref 0.0–0.7)
Eosinophils Relative: 1.1 % (ref 0.0–5.0)
HCT: 44.6 % (ref 39.0–52.0)
Hemoglobin: 14.7 g/dL (ref 13.0–17.0)
Lymphocytes Relative: 35.8 % (ref 12.0–46.0)
Lymphs Abs: 1.5 K/uL (ref 0.7–4.0)
MCHC: 32.9 g/dL (ref 30.0–36.0)
MCV: 89 fl (ref 78.0–100.0)
Monocytes Absolute: 0.3 K/uL (ref 0.1–1.0)
Monocytes Relative: 7 % (ref 3.0–12.0)
Neutro Abs: 2.2 K/uL (ref 1.4–7.7)
Neutrophils Relative %: 55.3 % (ref 43.0–77.0)
Platelets: 195 K/uL (ref 150.0–400.0)
RBC: 5.02 Mil/uL (ref 4.22–5.81)
RDW: 14.9 % (ref 11.5–15.5)
WBC: 4.1 K/uL (ref 4.0–10.5)

## 2024-02-24 LAB — B12 AND FOLATE PANEL
Folate: 13.9 ng/mL (ref >5.9)
Vitamin B-12: 449 pg/mL (ref 211–911)

## 2024-02-24 LAB — BASIC METABOLIC PANEL WITH GFR
BUN: 16 mg/dL (ref 6–23)
CO2: 26 meq/L (ref 19–32)
Calcium: 9.1 mg/dL (ref 8.4–10.5)
Chloride: 104 meq/L (ref 96–112)
Creatinine, Ser: 0.8 mg/dL (ref 0.40–1.50)
GFR: 92.71 mL/min (ref >60.00)
Glucose, Bld: 87 mg/dL (ref 70–99)
Potassium: 3.9 meq/L (ref 3.5–5.1)
Sodium: 139 meq/L (ref 135–145)

## 2024-02-24 LAB — HEMOGLOBIN A1C: Hgb A1c MFr Bld: 5.4 % (ref 4.6–6.5)

## 2024-02-24 NOTE — Progress Notes (Signed)
 Established Patient Office Visit   Subjective:  Patient ID: Donald Hampton, male    DOB: June 03, 1960  Age: 63 y.o. MRN: [id number]  Chief Complaint  Patient presents with   Hypertension    3 month f/u HTN.  No concerns.     Hypertension Pertinent negatives include no blurred vision.   Encounter Diagnoses  Name Primary?   Elevated glucose Yes   Iron  deficiency    B12 deficiency    Normocytic anemia    For follow-up of above.  It turns out he had been donating power reds which are essentially 2 units of packed red blood cells.  He has since stopped.  No family history of diabetes.  He does carbohydrate load before wounds.  Daughter is getting ready to receive her PhD in education.  Recently moved his mom down to this area from [place].  She is at [employer].  His brother passed recently from cirrhosis.  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   Review of Systems  Constitutional: Negative.   HENT: Negative.    Eyes:  Negative for blurred vision, discharge and redness.  Respiratory: Negative.    Cardiovascular: Negative.   Gastrointestinal:  Negative for abdominal pain.  Genitourinary: Negative.   Musculoskeletal: Negative.  Negative for myalgias.  Skin:  Negative for rash.  Neurological:  Negative for tingling, loss of consciousness and weakness.  Endo/Heme/Allergies:  Negative for polydipsia.     Current Outpatient Medications:    amLODipine  (NORVASC ) 5 MG tablet, TAKE ONE TABLET BY MOUTH ONE TIME DAILY, Disp: 90 tablet, Rfl: 1   cyanocobalamin  (VITAMIN B12) 1000 MCG tablet, Take 1 tablet (1,000 mcg total) by mouth daily., Disp: 90 tablet, Rfl: 3   Iron , Ferrous Sulfate , 325 (65 Fe) MG TABS, Take 325 mg by mouth daily., Disp: 100 tablet, Rfl: 3   losartan  (COZAAR ) 100 MG tablet, TAKE ONE TABLET BY MOUTH ONE TIME DAILY, Disp: 90 tablet, Rfl: 1   Objective:     BP 136/78   Pulse (!) 48   Temp 97.6 F (36.4 C) (Temporal)   Ht 5' 10  (1.778 m)   Wt 167 lb 6.4 oz (75.9 kg)   SpO2 99%   BMI 24.02 kg/m    Physical Exam Constitutional:      General: He is not in acute distress.    Appearance: Normal appearance. He is not ill-appearing, toxic-appearing or diaphoretic.  HENT:     Head: Normocephalic and atraumatic.     Right Ear: External ear normal.     Left Ear: External ear normal.  Eyes:     General: No scleral icterus.       Right eye: No discharge.        Left eye: No discharge.     Extraocular Movements: Extraocular movements intact.     Conjunctiva/sclera: Conjunctivae normal.  Pulmonary:     Effort: Pulmonary effort is normal. No respiratory distress.  Skin:    General: Skin is warm and dry.  Neurological:     Mental Status: He is alert and oriented to person, place, and time.  Psychiatric:        Mood and Affect: Mood normal.        Behavior: Behavior normal.      No results found for any visits on 02/24/24.    The 10-year ASCVD risk score (Arnett DK, et al., 2019) is: 11.4%    Assessment & Plan:   Elevated glucose -  Hemoglobin A1c -     Basic metabolic panel with GFR  Iron  deficiency -     CBC with Differential/Platelet -     Iron , TIBC and Ferritin Panel  B12 deficiency -     B12 and Folate Panel -     CBC with Differential/Platelet  Normocytic anemia -     CBC with Differential/Platelet    Return in about 3 months (around 05/26/2024).  Information was given on preventing type 2 diabetes.  Asked him to be mindful of intake of simple carbohydrates.  Will hold blood donations.  Elsie Sim Lent, MD

## 2024-02-25 ENCOUNTER — Ambulatory Visit: Payer: Self-pay | Admitting: Family Medicine

## 2024-04-06 ENCOUNTER — Ambulatory Visit

## 2024-04-06 VITALS — BP 128/70 | HR 64 | Temp 97.8°F | Ht 70.0 in | Wt 170.6 lb

## 2024-04-06 DIAGNOSIS — Z0000 Encounter for general adult medical examination without abnormal findings: Secondary | ICD-10-CM

## 2024-04-06 NOTE — Progress Notes (Signed)
 Chief Complaint  Patient presents with   Medicare Wellness     Subjective:   Donald Hampton is a 63 y.o. male who presents for a Medicare Annual Wellness Visit.  Visit info / Clinical Intake: Medicare Wellness Visit Type:: Initial Annual Wellness Visit Persons participating in visit and providing information:: patient Medicare Wellness Visit Mode:: In-person (required for WTM) Interpreter Needed?: No Pre-visit prep was completed: yes AWV questionnaire completed by patient prior to visit?: yes Date:: 03/30/24 Living arrangements:: lives with spouse/significant other Patient's Overall Health Status Rating: excellent Typical amount of pain: none Does pain affect daily life?: no Are you currently prescribed opioids?: no  Dietary Habits and Nutritional Risks How many meals a day?: 3 Eats fruit and vegetables daily?: yes Most meals are obtained by: preparing own meals In the last 2 weeks, have you had any of the following?: none Diabetic:: no  Functional Status Activities of Daily Living (to include ambulation/medication): (Patient-Rptd) Independent Ambulation: (Patient-Rptd) Independent Medication Administration: Independent Home Management (perform basic housework or laundry): (Patient-Rptd) Independent Manage your own finances?: yes Primary transportation is: driving Concerns about vision?: no *vision screening is required for WTM* Concerns about hearing?: no  Fall Screening Falls in the past year?: (Patient-Rptd) 0 Number of falls in past year: (Patient-Rptd) 0 Was there an injury with Fall?: (Patient-Rptd) 0 Fall Risk Category Calculator: (Patient-Rptd) 0 Patient Fall Risk Level: (Patient-Rptd) Low Fall Risk  Fall Risk Patient at Risk for Falls Due to: Medication side effect Fall risk Follow up: Falls prevention discussed; Falls evaluation completed  Home and Transportation Safety: All rugs have non-skid backing?: (!) no All stairs or steps have railings?:  yes Grab bars in the bathtub or shower?: (!) no Have non-skid surface in bathtub or shower?: yes Good home lighting?: yes Regular seat belt use?: yes Hospital stays in the last year:: no  Cognitive Assessment Difficulty concentrating, remembering, or making decisions? : yes Will 6CIT or Mini Cog be Completed: yes What year is it?: 0 points What month is it?: 0 points Give patient an address phrase to remember (5 components): [address] About what time is it?: 0 points Count backwards from 20 to 1: 0 points Say the months of the year in reverse: 0 points Repeat the address phrase from earlier: 0 points 6 CIT Score: 0 points  Advance Directives (For Healthcare) Does Patient Have a Medical Advance Directive?: Yes Type of Advance Directive: Healthcare Power of [place]; Living will Copy of Healthcare Power of Attorney in Chart?: No - copy requested Copy of Living Will in Chart?: No - copy requested  Reviewed/Updated  Reviewed/Updated: Reviewed All (Medical, Surgical, Family, Medications, Allergies, Care Teams, Patient Goals)    Allergies (verified) Patient has no known allergies.   Current Medications (verified) Outpatient Encounter Medications as of 04/06/2024  Medication Sig   amLODipine  (NORVASC ) 5 MG tablet TAKE ONE TABLET BY MOUTH ONE TIME DAILY   losartan  (COZAAR ) 100 MG tablet TAKE ONE TABLET BY MOUTH ONE TIME DAILY   cyanocobalamin  (VITAMIN B12) 1000 MCG tablet Take 1 tablet (1,000 mcg total) by mouth daily.   Iron , Ferrous Sulfate , 325 (65 Fe) MG TABS Take 325 mg by mouth daily.   No facility-administered encounter medications on file as of 04/06/2024.    History: Past Medical History:  Diagnosis Date   Asthma    beginning left knee   Chicken pox    Chronic kidney disease    Frequent headaches    Hypertension  Kidney stones    Migraines    Past Surgical History:  Procedure Laterality Date   COLONOSCOPY     EYE SURGERY  1999   Lasik   HERNIA  REPAIR  2020   LITHOTRIPSY     REFRACTIVE SURGERY     TONSILLECTOMY     Family History  Problem Relation Age of Onset   Heart disease Mother    Kidney disease Father    Arthritis Father    Alcohol abuse Father    Heart disease Maternal Grandfather    Early death Paternal Grandmother    Early death Paternal Grandfather    Colon cancer Neg Hx    Colon polyps Neg Hx    Esophageal cancer Neg Hx    Rectal cancer Neg Hx    Stomach cancer Neg Hx    Social History   Occupational History   Not on file  Tobacco Use   Smoking status: Never   Smokeless tobacco: Never  Vaping Use   Vaping status: Never Used  Substance and Sexual Activity   Alcohol use: Yes    Comment: rarely   Drug use: No   Sexual activity: Yes    Partners: Female   Tobacco Counseling Counseling given: Not Answered  SDOH Screenings   Food Insecurity: No Food Insecurity (04/06/2024)  Housing: Unknown (04/06/2024)  Transportation Needs: No Transportation Needs (04/06/2024)  Utilities: Not At Risk (04/06/2024)  Alcohol Screen: Low Risk  (04/06/2024)  Depression (PHQ2-9): Low Risk  (04/06/2024)  Financial Resource Strain: Low Risk  (04/06/2024)  Physical Activity: Sufficiently Active (04/06/2024)  Social Connections: Socially Integrated (04/06/2024)  Stress: Stress Concern Present (04/06/2024)  Tobacco Use: Low Risk  (04/06/2024)  Health Literacy: Adequate Health Literacy (04/06/2024)   See flowsheets for full screening details  Depression Screen PHQ 2 & 9 Depression Scale- Over the past 2 weeks, how often have you been bothered by any of the following problems? Little interest or pleasure in doing things: 0 Feeling down, depressed, or hopeless (PHQ Adolescent also includes...irritable): 0 PHQ-2 Total Score: 0 Trouble falling or staying asleep, or sleeping too much: 0 Feeling tired or having little energy: 0 Poor appetite or overeating (PHQ Adolescent also includes...weight loss): 0 Feeling bad about yourself - or  that you are a failure or have let yourself or your family down: 0 Trouble concentrating on things, such as reading the newspaper or watching television (PHQ Adolescent also includes...like school work): 0 Moving or speaking so slowly that other people could have noticed. Or the opposite - being so fidgety or restless that you have been moving around a lot more than usual: 0 Thoughts that you would be better off dead, or of hurting yourself in some way: 0 PHQ-9 Total Score: 0 If you checked off any problems, how difficult have these problems made it for you to do your work, take care of things at home, or get along with other people?: Not difficult at all  Depression Treatment Depression Interventions/Treatment : [id number] Score <4 Follow-up Not Indicated     Goals Addressed             This Visit's Progress    Patient Stated       04/06/2024, maintain weight             Objective:    Today's Vitals   04/06/24 0957 04/06/24 1015  BP: (!) 150/80 128/70  Pulse: 64   Temp: 97.8 F (36.6 C)   TempSrc: Oral   SpO2:  98%   Weight: 170 lb 9.6 oz (77.4 kg)   Height: 5' 10 (1.778 m)    Body mass index is 24.48 kg/m.  Hearing/Vision screen Hearing Screening - Comments:: Denies hearing issues Vision Screening - Comments:: Regular eye exams, [place] Immunizations and Health Maintenance Health Maintenance  Topic Date Due   DTaP/Tdap/Td (2 - Td or Tdap) 11/22/2023   Medicare Annual Wellness (AWV)  04/06/2025   Colonoscopy  03/05/2026   Pneumococcal Vaccine: 50+ Years  Completed   Influenza Vaccine  Completed   Hepatitis C Screening  Completed   HIV Screening  Completed   Zoster Vaccines- Shingrix   Completed   Hepatitis B Vaccines 19-59 Average Risk  Aged Out   Meningococcal B Vaccine  Aged Out   COVID-19 Vaccine  Discontinued        Assessment/Plan:  This is a routine wellness examination for [place].  Patient Care Team: Berneta Elsie Sayre, MD as PCP - General  (Family Medicine) Sheldon Standing, MD as Consulting Physician (General Surgery)  I have personally reviewed and noted the following in the patient's chart:   Medical and social history Use of alcohol, tobacco or illicit drugs  Current medications and supplements including opioid prescriptions. Functional ability and status Nutritional status Physical activity Advanced directives List of other physicians Hospitalizations, surgeries, and ER visits in previous 12 months Vitals Screenings to include cognitive, depression, and falls Referrals and appointments  No orders of the defined types were placed in this encounter.  In addition, I have reviewed and discussed with patient certain preventive protocols, quality metrics, and best practice recommendations. A written personalized care plan for preventive services as well as general preventive health recommendations were provided to patient.   Ardella FORBES Dawn, LPN   [id number]   Return in 1 year (on 04/06/2025).  After Visit Summary: (In Person-Printed) AVS printed and given to the patient  Nurse Notes: none

## 2024-04-06 NOTE — Patient Instructions (Addendum)
 [name],  Thank you for taking the time for your Medicare Wellness Visit. I appreciate your continued commitment to your health goals. Please review the care plan we discussed, and feel free to reach out if I can assist you further.  Please note that Annual Wellness Visits do not include a physical exam. Some assessments may be limited, especially if the visit was conducted virtually. If needed, we may recommend an in-person follow-up with your provider.  Ongoing Care Seeing your primary care provider every 3 to 6 months helps us  monitor your health and provide consistent, personalized care.   Referrals If a referral was made during today's visit and you haven't received any updates within two weeks, please contact the referred provider directly to check on the status.  Recommended Screenings:  Health Maintenance  Topic Date Due   DTaP/Tdap/Td vaccine (2 - Td or Tdap) 11/22/2023   Medicare Annual Wellness Visit  04/06/2025   Colon Cancer Screening  03/05/2026   Pneumococcal Vaccine for age over 59  Completed   Flu Shot  Completed   Hepatitis C Screening  Completed   HIV Screening  Completed   Zoster (Shingles) Vaccine  Completed   Hepatitis B Vaccine  Aged Out   Meningitis B Vaccine  Aged Out   COVID-19 Vaccine  Discontinued       03/30/2024    9:36 AM  Advanced Directives  Does Patient Have a Medical Advance Directive? Yes  Type of Estate Agent of [place];Living will  Copy of Healthcare Power of Attorney in Chart? No - copy requested    Vision: Annual vision screenings are recommended for early detection of glaucoma, cataracts, and diabetic retinopathy. These exams can also reveal signs of chronic conditions such as diabetes and high blood pressure.  Dental: Annual dental screenings help detect early signs of oral cancer, gum disease, and other conditions linked to overall health, including heart disease and diabetes.  Please see the attached  documents for additional preventive care recommendations.

## 2024-04-16 ENCOUNTER — Other Ambulatory Visit: Payer: Self-pay | Admitting: Family Medicine

## 2024-04-16 DIAGNOSIS — I10 Essential (primary) hypertension: Secondary | ICD-10-CM

## 2024-06-01 ENCOUNTER — Ambulatory Visit: Admitting: Family Medicine

## 2024-07-02 ENCOUNTER — Ambulatory Visit: Admitting: Family Medicine

## 2024-11-19 ENCOUNTER — Encounter: Admitting: Family Medicine

## 2025-04-12 ENCOUNTER — Ambulatory Visit
